# Patient Record
Sex: Female | Born: 1985 | Race: Black or African American | Hispanic: No | Marital: Single | Smoking: Never smoker
Health system: Southern US, Community
[De-identification: ages and names within clinical notes are randomized; demographics above are authoritative.]

## PROBLEM LIST (undated history)

## (undated) DIAGNOSIS — Z789 Other specified health status: Secondary | ICD-10-CM

## (undated) HISTORY — PX: NO PAST SURGERIES: SHX2092

---
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
## 2007-06-15 ENCOUNTER — Emergency Department (HOSPITAL_COMMUNITY): Admission: EM | Admit: 2007-06-15 | Discharge: 2007-06-15 | Payer: Self-pay | Admitting: Emergency Medicine

## 2007-09-02 ENCOUNTER — Inpatient Hospital Stay (HOSPITAL_COMMUNITY): Admission: AD | Admit: 2007-09-02 | Discharge: 2007-09-02 | Payer: Self-pay | Admitting: Obstetrics & Gynecology

## 2007-09-03 ENCOUNTER — Inpatient Hospital Stay (HOSPITAL_COMMUNITY): Admission: AD | Admit: 2007-09-03 | Discharge: 2007-09-07 | Payer: Self-pay | Admitting: Obstetrics

## 2007-09-08 ENCOUNTER — Inpatient Hospital Stay (HOSPITAL_COMMUNITY): Admission: AD | Admit: 2007-09-08 | Discharge: 2007-09-08 | Payer: Self-pay | Admitting: Obstetrics & Gynecology

## 2007-10-01 ENCOUNTER — Inpatient Hospital Stay (HOSPITAL_COMMUNITY): Admission: AD | Admit: 2007-10-01 | Discharge: 2007-10-01 | Payer: Self-pay | Admitting: Obstetrics

## 2007-10-07 ENCOUNTER — Ambulatory Visit (HOSPITAL_COMMUNITY): Admission: RE | Admit: 2007-10-07 | Discharge: 2007-10-07 | Payer: Self-pay | Admitting: Obstetrics & Gynecology

## 2007-10-11 ENCOUNTER — Inpatient Hospital Stay (HOSPITAL_COMMUNITY): Admission: RE | Admit: 2007-10-11 | Discharge: 2007-10-13 | Payer: Self-pay | Admitting: Obstetrics & Gynecology

## 2008-05-27 IMAGING — US US FETAL BPP W/O NONSTRESS
1 series · 14 of 28 positions shown · non-contrast
Comparison: none

OBSTETRICAL ULTRASOUND:

 This ultrasound exam was performed in the [HOSPITAL] Ultrasound Department.  The OB US report was generated in the AS system, and faxed to the ordering physician.  This report is also available in [REDACTED] PACS.

[Series 1: us fetal bpp w/o nonstress · non-contrast · 29 acquisitions, 14 frames shown]
[im 2/29]
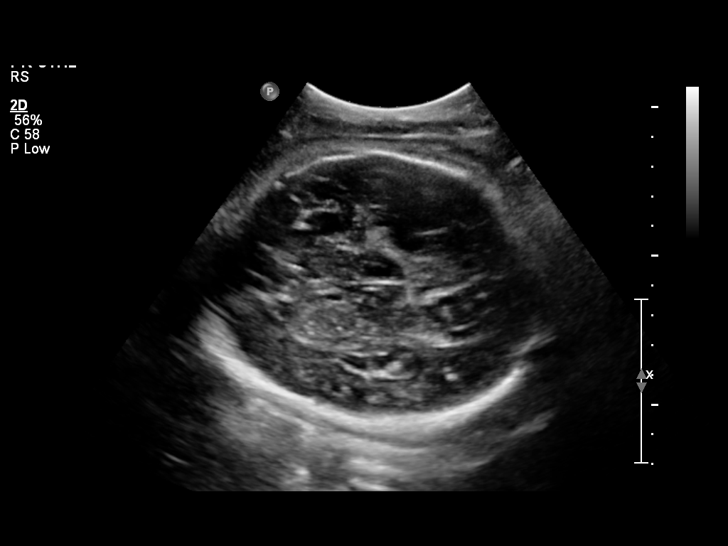
[im 4/29]
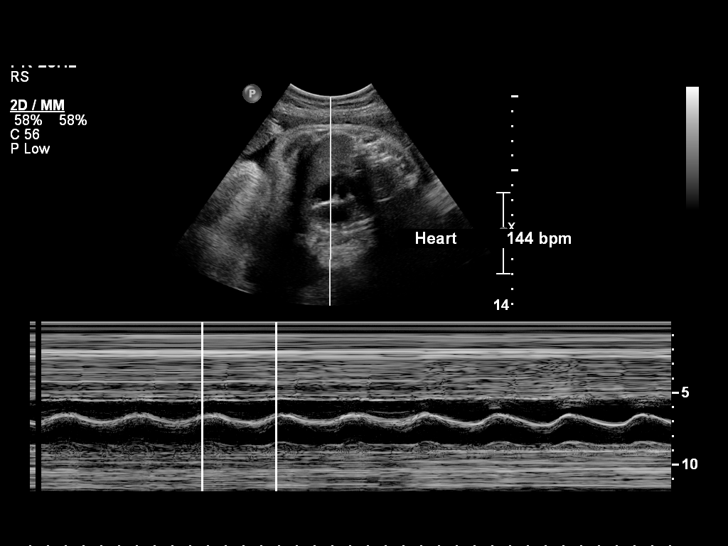
[im 6/29]
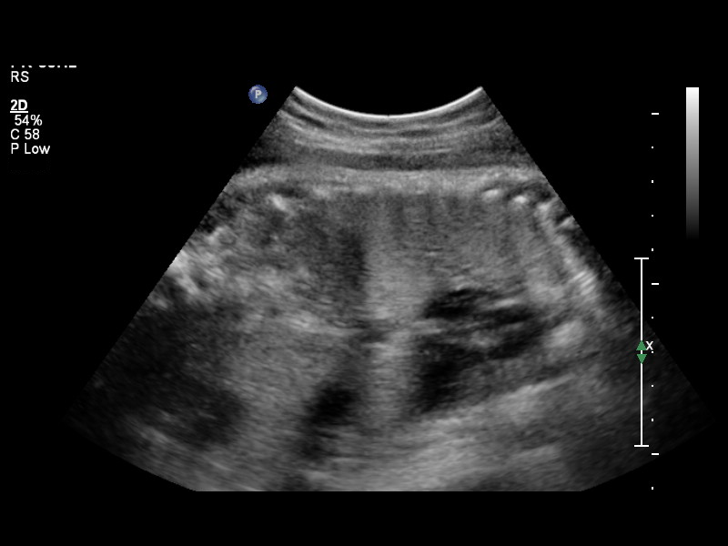
[im 8/29]
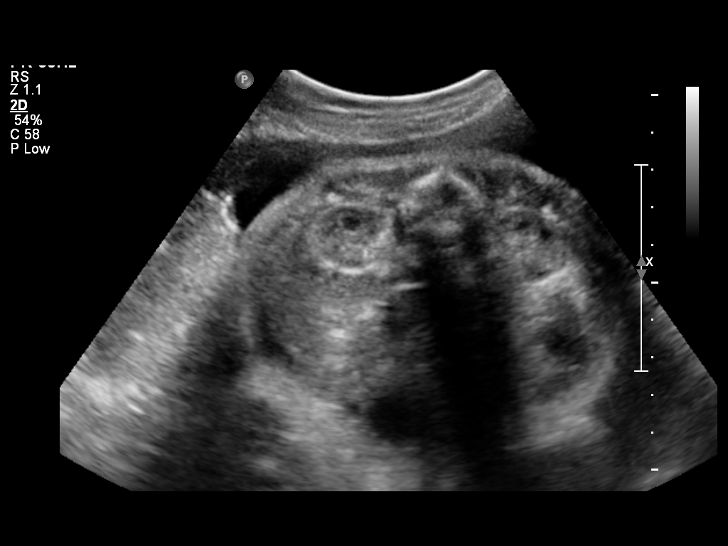
[im 10/29]
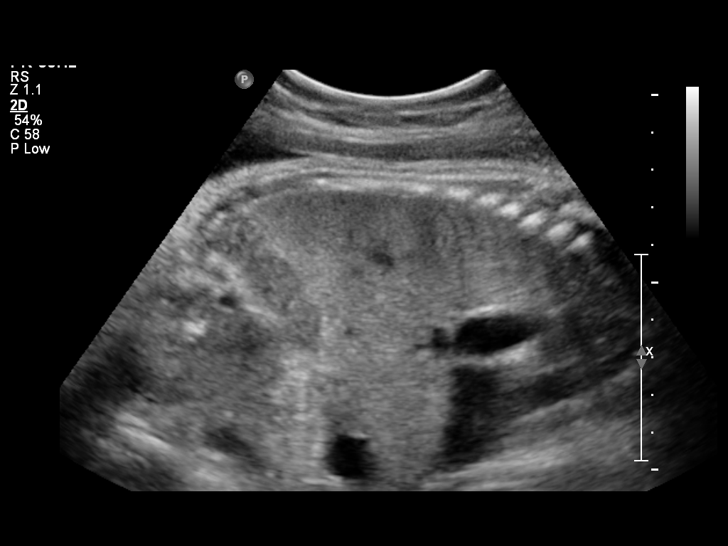
[im 12/29]
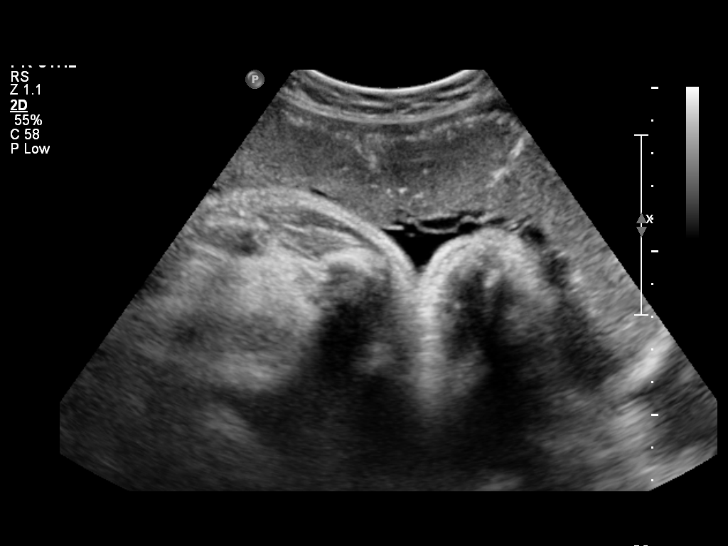
[im 14/29]
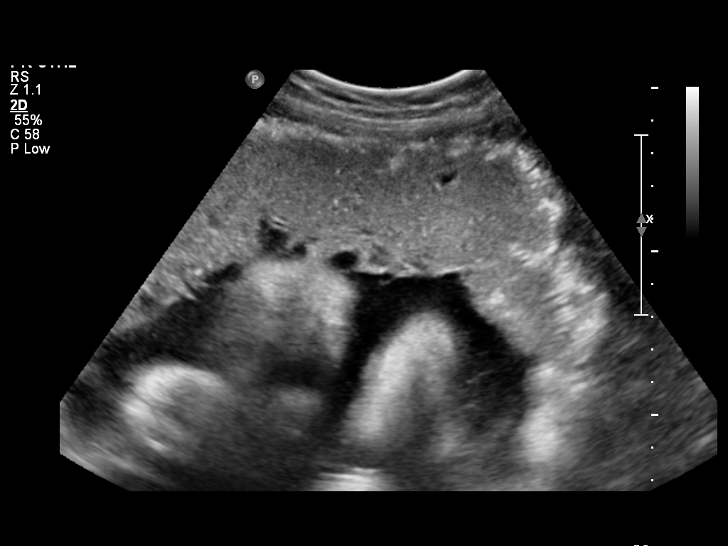
[im 16/29]
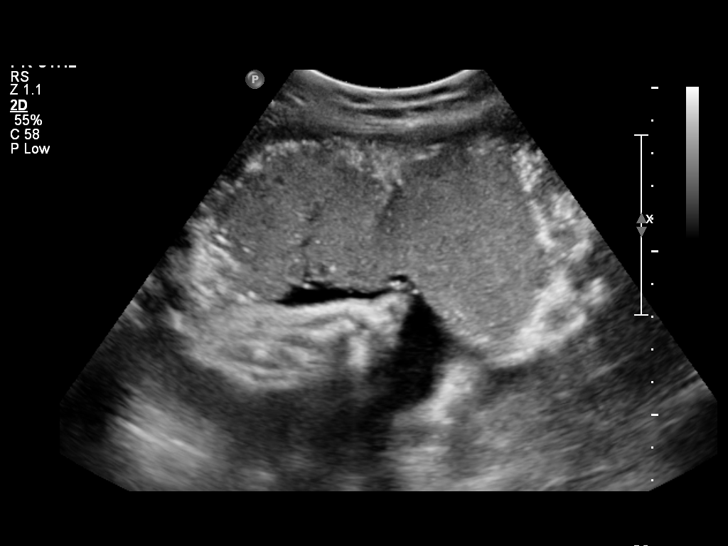
[im 18/29]
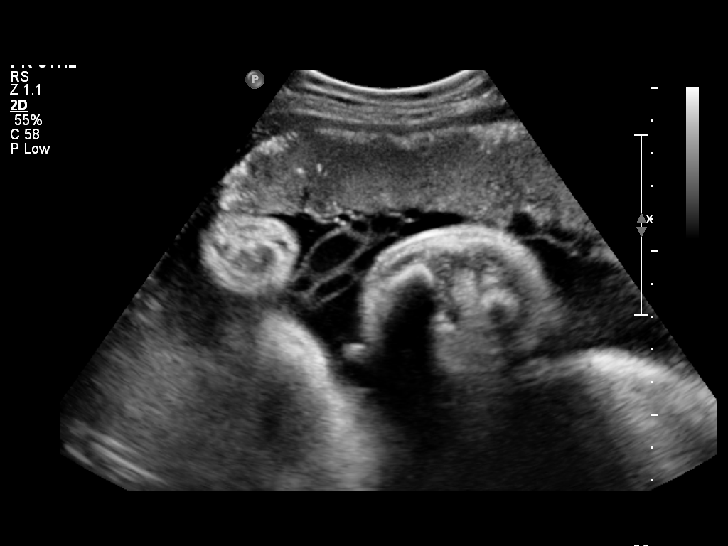
[im 20/29]
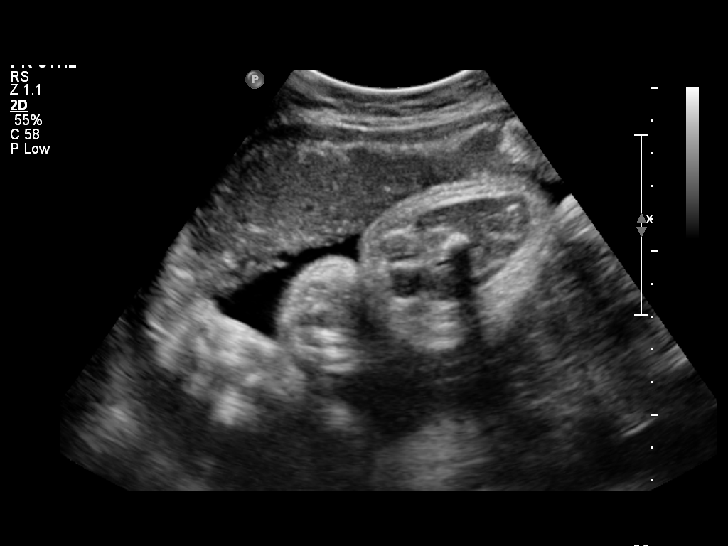
[im 22/29]
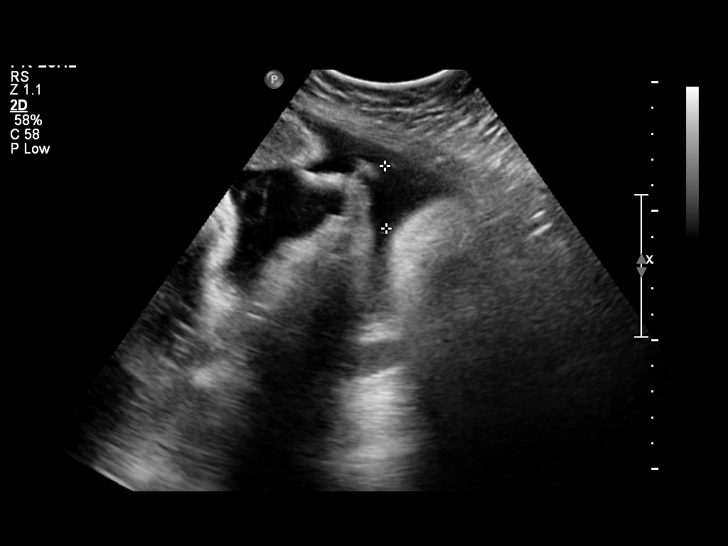
[im 24/29]
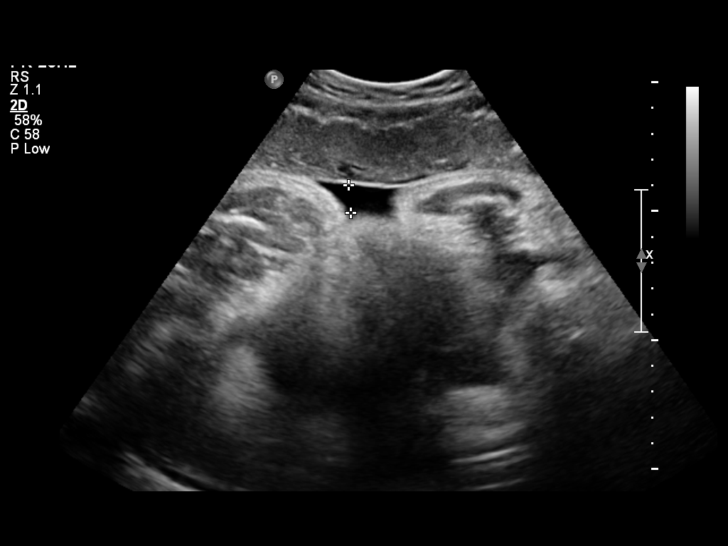
[im 26/29]
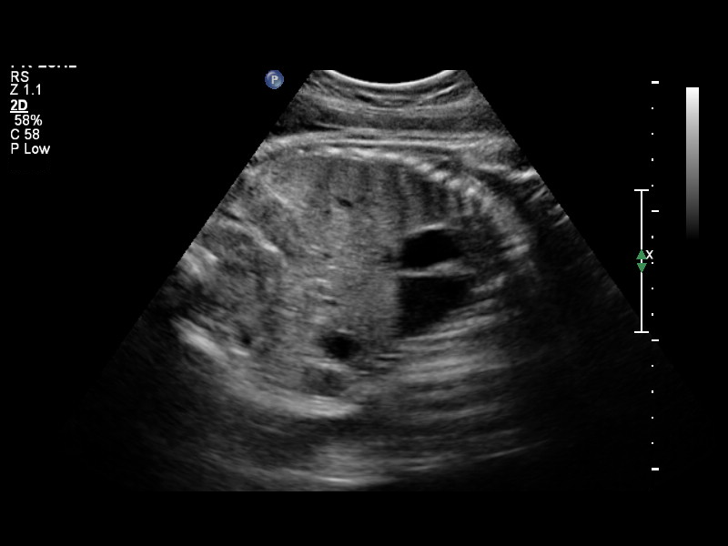
[im 29/29]
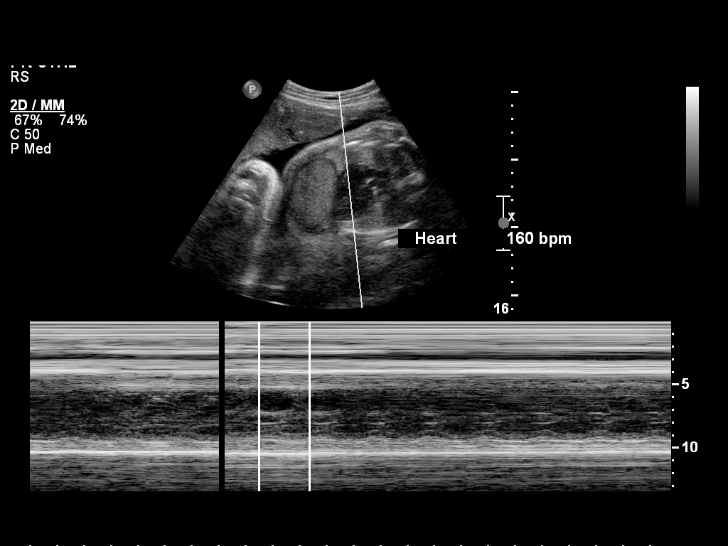

[14 of 28 positions shown; findings below may reference images not displayed]

IMPRESSION: See AS Obstetric US report.

## 2008-08-21 ENCOUNTER — Emergency Department (HOSPITAL_COMMUNITY): Admission: EM | Admit: 2008-08-21 | Discharge: 2008-08-21 | Payer: Self-pay | Admitting: Family Medicine

## 2008-08-22 ENCOUNTER — Emergency Department (HOSPITAL_COMMUNITY): Admission: EM | Admit: 2008-08-22 | Discharge: 2008-08-22 | Payer: Self-pay | Admitting: Emergency Medicine

## 2008-08-24 ENCOUNTER — Inpatient Hospital Stay (HOSPITAL_COMMUNITY): Admission: EM | Admit: 2008-08-24 | Discharge: 2008-08-26 | Payer: Self-pay | Admitting: Emergency Medicine

## 2009-02-21 ENCOUNTER — Emergency Department (HOSPITAL_COMMUNITY): Admission: EM | Admit: 2009-02-21 | Discharge: 2009-02-22 | Payer: Self-pay | Admitting: Emergency Medicine

## 2009-06-06 ENCOUNTER — Emergency Department (HOSPITAL_COMMUNITY): Admission: EM | Admit: 2009-06-06 | Discharge: 2009-06-06 | Payer: Self-pay | Admitting: Family Medicine

## 2009-10-24 ENCOUNTER — Encounter: Admission: RE | Admit: 2009-10-24 | Discharge: 2009-10-24 | Payer: Self-pay | Admitting: Internal Medicine

## 2010-01-29 ENCOUNTER — Emergency Department (HOSPITAL_COMMUNITY): Admission: EM | Admit: 2010-01-29 | Discharge: 2010-01-30 | Payer: Self-pay | Admitting: Emergency Medicine

## 2010-04-01 ENCOUNTER — Emergency Department (HOSPITAL_COMMUNITY): Admission: EM | Admit: 2010-04-01 | Discharge: 2010-04-01 | Payer: Self-pay | Admitting: Emergency Medicine

## 2010-04-11 ENCOUNTER — Inpatient Hospital Stay (HOSPITAL_COMMUNITY): Admission: AD | Admit: 2010-04-11 | Discharge: 2010-04-11 | Payer: Self-pay | Admitting: Obstetrics

## 2010-04-13 ENCOUNTER — Ambulatory Visit: Payer: Self-pay | Admitting: Advanced Practice Midwife

## 2010-04-13 ENCOUNTER — Inpatient Hospital Stay (HOSPITAL_COMMUNITY): Admission: AD | Admit: 2010-04-13 | Discharge: 2010-04-14 | Payer: Self-pay | Admitting: Obstetrics

## 2010-04-19 ENCOUNTER — Ambulatory Visit: Payer: Self-pay | Admitting: Nurse Practitioner

## 2010-04-19 ENCOUNTER — Inpatient Hospital Stay (HOSPITAL_COMMUNITY)
Admission: AD | Admit: 2010-04-19 | Discharge: 2010-04-20 | Payer: Self-pay | Source: Home / Self Care | Admitting: Obstetrics

## 2010-05-24 ENCOUNTER — Ambulatory Visit (HOSPITAL_COMMUNITY): Admission: RE | Admit: 2010-05-24 | Discharge: 2010-05-24 | Payer: Self-pay | Admitting: Obstetrics

## 2010-07-22 ENCOUNTER — Ambulatory Visit (HOSPITAL_COMMUNITY): Admission: RE | Admit: 2010-07-22 | Discharge: 2010-07-22 | Payer: Self-pay | Admitting: Obstetrics

## 2010-09-01 ENCOUNTER — Inpatient Hospital Stay (HOSPITAL_COMMUNITY)
Admission: AD | Admit: 2010-09-01 | Discharge: 2010-09-01 | Payer: Self-pay | Source: Home / Self Care | Attending: Obstetrics | Admitting: Obstetrics

## 2010-09-13 ENCOUNTER — Inpatient Hospital Stay (HOSPITAL_COMMUNITY)
Admission: AD | Admit: 2010-09-13 | Discharge: 2010-09-13 | Payer: Self-pay | Source: Home / Self Care | Attending: Obstetrics | Admitting: Obstetrics

## 2010-10-06 ENCOUNTER — Encounter: Payer: Self-pay | Admitting: Obstetrics

## 2010-10-06 ENCOUNTER — Encounter: Payer: Self-pay | Admitting: Obstetrics & Gynecology

## 2010-11-17 ENCOUNTER — Inpatient Hospital Stay (HOSPITAL_COMMUNITY)
Admission: AD | Admit: 2010-11-17 | Discharge: 2010-11-17 | Disposition: A | Discharge status: Home / Self Care | Payer: Medicaid Other | Source: Ambulatory Visit | Attending: Obstetrics | Admitting: Obstetrics

## 2010-11-17 DIAGNOSIS — O479 False labor, unspecified: Secondary | ICD-10-CM | POA: Insufficient documentation

## 2010-11-25 LAB — URINALYSIS, ROUTINE W REFLEX MICROSCOPIC
Bilirubin Urine: NEGATIVE
Bilirubin Urine: NEGATIVE
Hgb urine dipstick: NEGATIVE
Ketones, ur: NEGATIVE mg/dL
Nitrite: NEGATIVE
Protein, ur: NEGATIVE mg/dL
Specific Gravity, Urine: 1.02 (ref 1.005–1.030)
Urobilinogen, UA: 1 mg/dL (ref 0.0–1.0)
pH: 6.5 (ref 5.0–8.0)
pH: 7 (ref 5.0–8.0)

## 2010-11-25 LAB — WET PREP, GENITAL
Trich, Wet Prep: NONE SEEN
Yeast Wet Prep HPF POC: NONE SEEN

## 2010-11-25 LAB — FETAL FIBRONECTIN: Fetal Fibronectin: POSITIVE — AB

## 2010-11-25 LAB — URINE MICROSCOPIC-ADD ON

## 2010-11-28 ENCOUNTER — Inpatient Hospital Stay (HOSPITAL_COMMUNITY)
Admission: AD | Admit: 2010-11-28 | Discharge: 2010-12-01 | DRG: 775 | Disposition: A | Discharge status: Home / Self Care | Payer: 59 | Source: Ambulatory Visit | Attending: Obstetrics | Admitting: Obstetrics

## 2010-11-28 LAB — CBC
Platelets: 193 10*3/uL (ref 150–400)
RBC: 4.46 MIL/uL (ref 3.87–5.11)
WBC: 8.8 10*3/uL (ref 4.0–10.5)

## 2010-11-29 LAB — URINE MICROSCOPIC-ADD ON

## 2010-11-29 LAB — URINALYSIS, ROUTINE W REFLEX MICROSCOPIC
Nitrite: NEGATIVE
Specific Gravity, Urine: >1.03 — ABNORMAL HIGH (ref 1.005–1.030)
Urobilinogen, UA: 1 mg/dL (ref 0.0–1.0)

## 2010-11-29 LAB — RPR: RPR Ser Ql: NONREACTIVE

## 2010-11-30 LAB — CBC
MCH: 27.9 pg (ref 26.0–34.0)
MCH: 29.4 pg (ref 26.0–34.0)
MCHC: 32.2 g/dL (ref 30.0–36.0)
MCHC: 33.8 g/dL (ref 30.0–36.0)
Platelets: 172 10*3/uL (ref 150–400)
Platelets: 249 10*3/uL (ref 150–400)
RBC: 5.37 MIL/uL — ABNORMAL HIGH (ref 3.87–5.11)
RDW: 13.2 % (ref 11.5–15.5)

## 2010-11-30 LAB — ABO/RH: ABO/RH(D): O POS

## 2010-11-30 LAB — URINE MICROSCOPIC-ADD ON

## 2010-11-30 LAB — WET PREP, GENITAL
Trich, Wet Prep: NONE SEEN
Yeast Wet Prep HPF POC: NONE SEEN

## 2010-11-30 LAB — DIFFERENTIAL
Basophils Absolute: 0 10*3/uL (ref 0.0–0.1)
Eosinophils Absolute: 0.1 10*3/uL (ref 0.0–0.7)
Eosinophils Relative: 1 % (ref 0–5)
Monocytes Absolute: 0.3 10*3/uL (ref 0.1–1.0)
Neutrophils Relative %: 61 % (ref 43–77)

## 2010-11-30 LAB — URINE CULTURE: Colony Count: 35000

## 2010-11-30 LAB — URINALYSIS, ROUTINE W REFLEX MICROSCOPIC
Glucose, UA: NEGATIVE mg/dL
Glucose, UA: NEGATIVE mg/dL
Hgb urine dipstick: NEGATIVE
Ketones, ur: 40 mg/dL — AB
Ketones, ur: >80 mg/dL — AB
Leukocytes, UA: NEGATIVE
Nitrite: NEGATIVE
Protein, ur: 30 mg/dL — AB
Protein, ur: NEGATIVE mg/dL
Urobilinogen, UA: 2 mg/dL — ABNORMAL HIGH (ref 0.0–1.0)

## 2010-11-30 LAB — GC/CHLAMYDIA PROBE AMP, GENITAL: GC Probe Amp, Genital: NEGATIVE

## 2010-11-30 LAB — POCT PREGNANCY, URINE: Preg Test, Ur: POSITIVE

## 2010-12-02 LAB — STREP A DNA PROBE: Group A Strep Probe: NEGATIVE

## 2010-12-02 LAB — RAPID STREP SCREEN (MED CTR MEBANE ONLY): Streptococcus, Group A Screen (Direct): NEGATIVE

## 2010-12-20 LAB — URINE CULTURE

## 2010-12-20 LAB — POCT URINALYSIS DIP (DEVICE)
Bilirubin Urine: NEGATIVE
Glucose, UA: NEGATIVE mg/dL
Nitrite: NEGATIVE
Specific Gravity, Urine: 1.02 (ref 1.005–1.030)
Urobilinogen, UA: 0.2 mg/dL (ref 0.0–1.0)
pH: 7 (ref 5.0–8.0)

## 2011-01-28 NOTE — H&P (Signed)
Kayla Brandt, Kayla Brandt             ACCOUNT NO.:  [id number]   MEDICAL RECORD NO.:  [id number]          PATIENT TYPE:  INP   LOCATION:  1823                         FACILITY:  [facility]   PHYSICIAN:  Vania Rea, M.D. DATE OF BIRTH:  1986-05-29   DATE OF ADMISSION:  08/23/2008  DATE OF DISCHARGE:                              HISTORY & PHYSICAL   PRIMARY CARE PHYSICIAN:  Unassigned.   CHIEF COMPLAINT:  Painful sore throat, difficulty breathing.   HISTORY OF PRESENT ILLNESS:  This is a 26 year old African American lady  with no significant past medical history who presents with a history of  fever, sore throat for 3-4 days.  Patient visited an urgent care center  3 days ago.  Had negative rapid strep test and was sent home with  oxycodone and ibuprofen liquid.  The patient came to the emergency room  yesterday because of continued sore throat, fever, nausea, vomiting, and  feeling sick.  She again had a rapid strep test which was negative but  had an infectious mono screen which was positive.  She was given IV  fluids and discharged home to continue symptomatic treatment.  However,  the patient returns today complaining of progressive swelling in her  throat, pain, and difficulty breathing.  Patient also complains of  nausea and vomiting.  She denies any headache.  Her last menstrual  period was December 4th, and it was normal.   PAST MEDICAL HISTORY:  Other than above, none significant.   MEDICATIONS:  1. Ibuprofen suspension 300 mg every 4-6 hours p.r.n.  2. Oxycodone/acetaminophen liquid 7.5/500 every 4-6 hours p.r.n.   ALLERGIES:  No known drug allergies.   SOCIAL HISTORY:  She denies tobacco, alcohol, or illicit drug use.  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   FAMILY HISTORY:  Significant for diabetes and hypertension in her  grandmother, but no other sicknesses in her immediate family.  She lives  at home with her parents  and siblings.  None of them have any type of  upper respiratory illness.   REVIEW OF SYSTEMS:  Other than noted above, 10-point review of systems  is unremarkable.   PHYSICAL EXAMINATION:  A mildly obese young African American lady  sitting up in the stretcher, ill-looking, mouth-breathing.  VITALS:  Temperature 101.7, pulse 130, respirations 26, blood pressure  127/83.  She is saturating at 99% on 2 liters.  Pupils are equal and round.  Mucous membranes are dry.  She is  anicteric.  She is mouth-breathing.  Her mouth is dry.  The inside of  her mouth is discolored red by the medicine she has been taking.  Her  tonsils are enlarged and crowding her pharynx and touching in the  middle.  Both tonsils are covered by grayish-yellow exudate.  No  intraoral rash is appreciated.  No cervical lymphadenopathy or thyromegaly is appreciated.  CHEST:  Clear to auscultation bilaterally.  CARDIOVASCULAR:  She is tachycardic without murmur.  ABDOMEN:  Obese, soft, and nontender.  No masses are felt.  EXTREMITIES:  Without edema.  She has 3+ pulses bilaterally.  No calf  tenderness.  CENTRAL NERVOUS SYSTEM:  Cranial nerves II-XII are grossly intact.  She  has no focal neurologic deficit.   LABS:  White count 12.4, down from 14.9 on the 8th.  Her hemoglobin is  13.3, down from 15.3 on the 7th.  Her platelets are 213.  She has 53%  neutrophils.  Her absolute neutrophil count is 6.6, which is down from  10.5 on the 7th.  Her absolute monocyte count is also elevated at 1.6.  Absolute neutrophil count is normal at 4.3.  The smear describes  atypical lymphocytes suggesting a reactive infection.  Serum chemistry  shows a sodium of 131, down from 144 on the 7th.  Her potassium is 3.8.  Her chloride is 102.  Her CO2 is down to 17.  Glucose is 93.  BUN 7,  creatinine 0.67.  Her calcium is 8.1.  Strep throat done on the 8th was  negative.  Urinalysis today shows greater than 80 ketones, 100 protein,   negative nitrites and leukocyte esterase, over 100 protein, specific  gravity 1.027.   ASSESSMENT:  1. Acute infectious mononucleosis with respiratory compromise due to      crowding of the pharynx.  2. Hyponatremia due to dehydration.   PLAN:  We will admit this lady for anti-inflammatory therapy with NSAIDs  and steroids.  Will go ahead and give broad-spectrum antibiotic coverage  in case of secondary infection.  She does not have symptoms of an acute  viral illness such as H1N1 but will keep her in respiratory isolation.  Other plans as per orders.      Vania Rea, M.D.  Electronically Signed     LC/MEDQ  D:  08/24/2008  T:  08/24/2008  Job:  [id number]

## 2011-01-28 NOTE — Discharge Summary (Signed)
Kayla Brandt, Kayla Brandt             ACCOUNT NO.:  [id number]   MEDICAL RECORD NO.:  [id number]          PATIENT TYPE:  INP   LOCATION:  4712                         FACILITY:  [facility]   PHYSICIAN:  Eduard Clos, MDDATE OF BIRTH:  12-Sep-1986   DATE OF ADMISSION:  08/23/2008  DATE OF DISCHARGE:                               DISCHARGE SUMMARY   HOSPITAL COURSE:  A 25 year old female with no significant past medical  history presented to the ER with complaints of increasing painful sore  throat with difficulty breathing.  The patient was admitted because of  difficulty breathing and swallowing.  Monospot test was positive.  Rapid  strep test was negative.  The patient was admitted to medical floor, was  started on IV steroids, antibiotic fluids, gradually the patient's  symptoms improved, and she was started on diet.  At this time, her  symptoms have significantly improved and she is requesting discharge.   At this time, the patient is able to breath without any discomfort, able  to swallow, and has had dinner 100% and breakfast 100%.  We will  discharge the patient home with 3 more days of Zithromax, have reviewed  up to date wherein they have indicated steroids for mononucleosis with  presentation of difficulty breathing.  We will taper steroids for over 6  days.  The patient's lymph nodes were examined, and it was not showing  any significant  lymphadenopathy at this time, and there was no  splenomegaly appreciable.  Anyway, I have discussed with the patient,  and advised no contact sports for at least 1 month or until cleared by  PCP.   PROCEDURE:  August 21, 2008, x-ray of the neck and soft tissues shows  enlarged lymph nodes in [place] ring.   FINAL DIAGNOSES:  1. Acute pharyngitis probably from infectious mononucleosis.  2. Hyponatremia from dehydration, resolved.   DISCHARGE MEDICATIONS:  1. Zithromax 250 mg p.o. daily for 3 days.  2. Prednisone 20 mg p.o. daily for 2  days, followed by 10 mg p.o.      daily for 2 days, followed by 5 mg p.o. daily for 2 days, and stop.  3. Omeprazole 40 mg p.o. daily.  4. Motrin 800 mg p.o. q.8 h. p.r.n. pain.   The patient advised to follow up with the primary care physician within  the next 7 days.  I have provided Dr. Della Goo number.  The  patient advised to call and make an appointment, as she has no PCP.  No  contact sports for at least 1 month or until cleared by PCP, in any  event where the patient develops difficulty breathing or swallowing  again, the patient advised to go to the nearest ER as soon as possible.      Eduard Clos, MD  Electronically Signed    ANK/MEDQ  D:  08/26/2008  T:  08/26/2008  Job:  [id number]

## 2011-01-28 NOTE — Discharge Summary (Signed)
NAMEDONNEISHA, BEANE             ACCOUNT NO.:  [id number]   MEDICAL RECORD NO.:  [id number]          PATIENT TYPE:  INP   LOCATION:  9151                          FACILITY:  [facility]   PHYSICIAN:  Roseanna Rainbow, M.D.DATE OF BIRTH:  August 24, 1986   DATE OF ADMISSION:  09/03/2007  DATE OF DISCHARGE:  09/07/2007                               DISCHARGE SUMMARY   DISCHARGE DIAGNOSES:  1. Intrauterine pregnancy at 35 plus weeks.  2. Threatened preterm labor.   CHIEF COMPLAINT:  The patient is a 25 year old, para 2, with an  intrauterine pregnancy, who presents at 78 plus weeks complaining of  uterine contractions.   HISTORY OF PRESENT ILLNESS:  The patient had presented to the Maternity  Admissions Unit one day prior with a similar complaint.  She was given a  dose of subcutaneous Terbutaline, IV fluid, and was discharged.   PAST OBSTETRIC HISTORY:  1. A preterm delivery at 25 weeks.  2. A spontaneous vaginal delivery at 37 weeks.   PAST MEDICAL HISTORY:  She denies.   MEDICATIONS:  Prenatal vitamins.   ALLERGIES:  No known drug allergies.   SOCIAL HISTORY:  She is single, she denies any tobacco, ethanol, or drug  use.   PHYSICAL EXAMINATION:  VITAL SIGNS:  Stable, afebrile.  LUNGS:  Clear to auscultation bilaterally.  HEART:  Regular rate and rhythm.  ABDOMEN:  Gravid and nontender.  PELVIC EXAM:  The cervix is 1 to 2 cm dilated, 50% effaced, with the  vertex at minus 3 station.   ASSESSMENT:  1. Intrauterine pregnancy at 34 plus weeks.  2. Preterm labor.   PLAN:  Admission and magnesium sulfate tocolysis.   HOSPITAL COURSE:  The patient was admitted.  She was started on  magnesium sulfate for tocolysis.  Her contractions improved.  On  December 22nd, the magnesium sulfate was discontinued.  On December  23rd, she had a rare uterine contraction.  A sterile vaginal exam was  without change.  An ultrasound showed a normal amniotic fluid index, no  praevia, estimated  fetal weight at the 55th percentile.  She was  subsequently discharged to home.   CONDITION:  Stable.   DIET:  Regular.   ACTIVITY:  Modified bedrest.   DISPOSITION:  The patient had a followup appointment with Dr. Clearance Coots on  December 29th at 1 p.m.      Roseanna Rainbow, M.D.  Electronically Signed     LAJ/MEDQ  D:  10/01/2007  T:  10/01/2007  Job:  [id number]

## 2011-05-05 ENCOUNTER — Inpatient Hospital Stay (INDEPENDENT_AMBULATORY_CARE_PROVIDER_SITE_OTHER)
Admission: RE | Admit: 2011-05-05 | Discharge: 2011-05-05 | Disposition: A | Discharge status: Home / Self Care | Payer: 59 | Source: Ambulatory Visit | Attending: Emergency Medicine | Admitting: Emergency Medicine

## 2011-05-05 DIAGNOSIS — N390 Urinary tract infection, site not specified: Secondary | ICD-10-CM

## 2011-05-05 LAB — POCT URINALYSIS DIP (DEVICE)
Nitrite: POSITIVE — AB
Protein, ur: 100 mg/dL — AB
pH: 7 (ref 5.0–8.0)

## 2011-05-05 LAB — POCT PREGNANCY, URINE: Preg Test, Ur: NEGATIVE

## 2011-06-05 LAB — CBC
MCHC: 34.5
Platelets: 202
Platelets: 233
RBC: 3.73 — ABNORMAL LOW
RDW: 13.7
WBC: 13 — ABNORMAL HIGH

## 2011-06-05 LAB — RPR: RPR Ser Ql: NONREACTIVE

## 2011-06-05 LAB — CCBB MATERNAL DONOR DRAW

## 2011-06-20 LAB — BASIC METABOLIC PANEL
BUN: 7 mg/dL (ref 6–23)
CO2: 17 mEq/L — ABNORMAL LOW (ref 19–32)
Chloride: 102 mEq/L (ref 96–112)
Chloride: 114 mEq/L — ABNORMAL HIGH (ref 96–112)
Creatinine, Ser: 0.67 mg/dL (ref 0.4–1.2)
GFR calc Af Amer: >60 mL/min (ref >60)
GFR calc non Af Amer: >60 mL/min (ref >60)
Potassium: 3.8 mEq/L (ref 3.5–5.1)
Potassium: 4.5 mEq/L (ref 3.5–5.1)
Sodium: 140 mEq/L (ref 135–145)

## 2011-06-20 LAB — URINE CULTURE
Colony Count: 3000
Special Requests: NEGATIVE

## 2011-06-20 LAB — DIFFERENTIAL
Basophils Absolute: 0 10*3/uL (ref 0.0–0.1)
Basophils Absolute: 0.1 10*3/uL (ref 0.0–0.1)
Basophils Relative: 0 % (ref 0–1)
Basophils Relative: 0 % (ref 0–1)
Eosinophils Absolute: 0 10*3/uL (ref 0.0–0.7)
Eosinophils Absolute: 0.1 10*3/uL (ref 0.0–0.7)
Eosinophils Relative: 0 % (ref 0–5)
Eosinophils Relative: 0 % (ref 0–5)
Lymphocytes Relative: 81 % — ABNORMAL HIGH (ref 12–46)
Lymphs Abs: 10.5 10*3/uL — ABNORMAL HIGH (ref 0.7–4.0)
Lymphs Abs: 6.6 10*3/uL — ABNORMAL HIGH (ref 0.7–4.0)
Lymphs Abs: 9.8 10*3/uL — ABNORMAL HIGH (ref 0.7–4.0)
Monocytes Absolute: 0.5 10*3/uL (ref 0.1–1.0)
Monocytes Absolute: 1.3 10*3/uL — ABNORMAL HIGH (ref 0.1–1.0)
Monocytes Absolute: 1.6 10*3/uL — ABNORMAL HIGH (ref 0.1–1.0)
Monocytes Relative: 4 % (ref 3–12)
Monocytes Relative: 9 % (ref 3–12)
Neutro Abs: 1.9 10*3/uL (ref 1.7–7.7)
Neutrophils Relative %: 15 % — ABNORMAL LOW (ref 43–77)
Neutrophils Relative %: 34 % — ABNORMAL LOW (ref 43–77)
Promyelocytes Absolute: 0 %

## 2011-06-20 LAB — URINALYSIS, ROUTINE W REFLEX MICROSCOPIC
Bilirubin Urine: NEGATIVE
Bilirubin Urine: NEGATIVE
Glucose, UA: NEGATIVE mg/dL
Hgb urine dipstick: NEGATIVE
Ketones, ur: >80 mg/dL — AB
Ketones, ur: >80 mg/dL — AB
Ketones, ur: 15 — AB
Ketones, ur: NEGATIVE
Leukocytes, UA: NEGATIVE
Nitrite: NEGATIVE
Protein, ur: 100 mg/dL — AB
Protein, ur: 30 mg/dL — AB
Protein, ur: NEGATIVE
Specific Gravity, Urine: 1.015
Urobilinogen, UA: 1 mg/dL (ref 0.0–1.0)
Urobilinogen, UA: 0.2
pH: 6 (ref 5.0–8.0)
pH: 6

## 2011-06-20 LAB — RAPID STREP SCREEN (MED CTR MEBANE ONLY): Streptococcus, Group A Screen (Direct): NEGATIVE

## 2011-06-20 LAB — CBC
HCT: 36.8 % (ref 36.0–46.0)
HCT: 39.2 % (ref 36.0–46.0)
HCT: 42.2 % (ref 36.0–46.0)
Hemoglobin: 12.5 g/dL (ref 12.0–15.0)
Hemoglobin: 14.7 g/dL (ref 12.0–15.0)
MCHC: 32.8 g/dL (ref 30.0–36.0)
MCHC: 33.9 g/dL (ref 30.0–36.0)
MCHC: 34.1 g/dL (ref 30.0–36.0)
MCV: 85.2 fL (ref 78.0–100.0)
MCV: 86.9 fL (ref 78.0–100.0)
Platelets: 212 10*3/uL (ref 150–400)
Platelets: 213 10*3/uL (ref 150–400)
Platelets: 235 10*3/uL (ref 150–400)
RBC: 4.24 MIL/uL (ref 3.87–5.11)
RBC: 4.6 MIL/uL (ref 3.87–5.11)
RBC: 5.11 MIL/uL (ref 3.87–5.11)
RDW: 12.3 % (ref 11.5–15.5)
WBC: 12.6 10*3/uL — ABNORMAL HIGH (ref 4.0–10.5)
WBC: 12.9 10*3/uL — ABNORMAL HIGH (ref 4.0–10.5)

## 2011-06-20 LAB — HEPATIC FUNCTION PANEL
ALT: 75 U/L — ABNORMAL HIGH (ref 0–35)
AST: 40 U/L — ABNORMAL HIGH (ref 0–37)
Alkaline Phosphatase: 70 U/L (ref 39–117)
Bilirubin, Direct: 0.1 mg/dL (ref 0.0–0.3)
Indirect Bilirubin: 0.3 mg/dL (ref 0.3–0.9)

## 2011-06-20 LAB — POCT RAPID STREP A: Streptococcus, Group A Screen (Direct): NEGATIVE

## 2011-06-20 LAB — URINE MICROSCOPIC-ADD ON

## 2011-06-20 LAB — POCT I-STAT, CHEM 8
BUN: 6 mg/dL (ref 6–23)
Creatinine, Ser: 1 mg/dL (ref 0.4–1.2)
Glucose, Bld: 86 mg/dL (ref 70–99)
Potassium: 3.8 mEq/L (ref 3.5–5.1)
Sodium: 144 mEq/L (ref 135–145)
TCO2: 26 mmol/L (ref 0–100)

## 2011-06-20 LAB — STREP A DNA PROBE

## 2012-07-12 ENCOUNTER — Emergency Department (HOSPITAL_COMMUNITY)
Admission: EM | Admit: 2012-07-12 | Discharge: 2012-07-12 | Disposition: A | Discharge status: Home / Self Care | Payer: 59 | Source: Home / Self Care | Attending: Family Medicine | Admitting: Family Medicine

## 2012-07-12 ENCOUNTER — Encounter (HOSPITAL_COMMUNITY): Payer: Self-pay | Admitting: Emergency Medicine

## 2012-07-12 DIAGNOSIS — N390 Urinary tract infection, site not specified: Secondary | ICD-10-CM

## 2012-07-12 LAB — POCT URINALYSIS DIP (DEVICE)
Glucose, UA: NEGATIVE mg/dL
Protein, ur: >=300 mg/dL — AB
Urobilinogen, UA: 1 mg/dL (ref 0.0–1.0)

## 2012-07-12 LAB — POCT PREGNANCY, URINE: Preg Test, Ur: NEGATIVE

## 2012-07-12 MED ORDER — CEPHALEXIN 500 MG PO CAPS: 500.0000 mg | ORAL_CAPSULE | Freq: Four times a day (QID) | ORAL | Status: DC

## 2012-07-12 NOTE — ED Provider Notes (Signed)
History     CSN: [id number]  Arrival date & time 07/12/12  0865   First MD Initiated Contact with Patient 07/12/12 1955      Chief Complaint  Patient presents with  . Urinary Tract Infection    (Consider location/radiation/quality/duration/timing/severity/associated sxs/prior treatment) Patient is a 26 y.o. female presenting with urinary tract infection. The history is provided by the patient.  Urinary Tract Infection This is a new problem. The current episode started more than 2 days ago. The problem has been gradually worsening. Pertinent negatives include no abdominal pain. Associated symptoms comments: No past h/o uti , nl menses ,no vag d/c.Marland Kitchen    History reviewed. No pertinent past medical history.  No past surgical history on file.  No family history on file.  History  Substance Use Topics  . Smoking status: Not on file  . Smokeless tobacco: Not on file  . Alcohol Use: Not on file    OB History    Grav Para Term Preterm Abortions TAB SAB Ect Mult Living                  Review of Systems  Constitutional: Negative.   Gastrointestinal: Negative.  Negative for abdominal pain.  Genitourinary: Positive for dysuria, urgency, frequency and hematuria.    Allergies  Review of patient's allergies indicates no known allergies.  Home Medications   Current Outpatient Rx  Name Route Sig Dispense Refill  . CEPHALEXIN 500 MG PO CAPS Oral Take 1 capsule (500 mg total) by mouth 4 (four) times daily. Take all of medicine and drink lots of fluids 20 capsule 0    BP 132/79  Pulse 108  Temp 98.4 F (36.9 C) (Oral)  Resp 16  SpO2 94%  Physical Exam  Nursing note and vitals reviewed. Constitutional: She is oriented to person, place, and time. She appears well-developed and well-nourished.  Abdominal: Soft. Bowel sounds are normal. She exhibits no distension and no mass. There is no tenderness. There is no rebound and no guarding.  Neurological: She is alert and  oriented to person, place, and time.  Skin: Skin is warm and dry.    ED Course  Procedures (including critical care time)  Labs Reviewed  POCT URINALYSIS DIP (DEVICE) - Abnormal; Notable for the following:    Ketones, ur TRACE (*)     Hgb urine dipstick LARGE (*)     Protein, ur >=300 (*)     Nitrite POSITIVE (*)     Leukocytes, UA MODERATE (*)  Biochemical Testing Only. Please order routine urinalysis from main lab if confirmatory testing is needed.   All other components within normal limits  POCT PREGNANCY, URINE   No results found.   1. UTI (lower urinary tract infection)       MDM  U/a pos.        Linna Hoff, MD 07/12/12 2020

## 2012-07-12 NOTE — ED Notes (Signed)
Reports sharp abdominal pain with blood in urine since 10/24. Frequently urinating, burning but denies odor.

## 2013-12-23 ENCOUNTER — Emergency Department (HOSPITAL_COMMUNITY)
Admission: EM | Admit: 2013-12-23 | Discharge: 2013-12-23 | Discharge status: Against Medical Advice | Payer: 59 | Attending: Emergency Medicine | Admitting: Emergency Medicine

## 2013-12-23 ENCOUNTER — Encounter (HOSPITAL_COMMUNITY): Payer: Self-pay | Admitting: Emergency Medicine

## 2013-12-23 DIAGNOSIS — R252 Cramp and spasm: Secondary | ICD-10-CM | POA: Insufficient documentation

## 2013-12-23 LAB — URINALYSIS, ROUTINE W REFLEX MICROSCOPIC
GLUCOSE, UA: NEGATIVE mg/dL
Ketones, ur: >80 mg/dL — AB
Nitrite: NEGATIVE
PH: 5.5 (ref 5.0–8.0)
Protein, ur: NEGATIVE mg/dL
SPECIFIC GRAVITY, URINE: 1.034 — AB (ref 1.005–1.030)
Urobilinogen, UA: 1 mg/dL (ref 0.0–1.0)

## 2013-12-23 LAB — PREGNANCY, URINE: PREG TEST UR: NEGATIVE

## 2013-12-23 LAB — URINE MICROSCOPIC-ADD ON

## 2013-12-23 NOTE — ED Notes (Signed)
The pt reported that her rt hand was cramped up and she could not use it.  However she used it to slide her hair backward abd a few minutes later her hand appeared noraml

## 2013-12-23 NOTE — ED Notes (Signed)
The pt is c/o cramps in both her arms hands and lower exteremities.  She also saw a large clot when she uirinated  Earlier today.  llmp feb

## 2013-12-23 NOTE — ED Notes (Signed)
Pt states she does not want to stay to be seen. States she wants to go home and go to bed.  Encouraged pt to stay and explained triage.  Family member states pt is leaving.

## 2013-12-24 ENCOUNTER — Emergency Department (HOSPITAL_COMMUNITY)
Admission: EM | Admit: 2013-12-24 | Discharge: 2013-12-24 | Disposition: A | Discharge status: Home / Self Care | Payer: 59 | Attending: Emergency Medicine | Admitting: Emergency Medicine

## 2013-12-24 ENCOUNTER — Encounter (HOSPITAL_COMMUNITY): Payer: Self-pay | Admitting: Emergency Medicine

## 2013-12-24 DIAGNOSIS — F411 Generalized anxiety disorder: Secondary | ICD-10-CM | POA: Insufficient documentation

## 2013-12-24 DIAGNOSIS — E876 Hypokalemia: Secondary | ICD-10-CM | POA: Insufficient documentation

## 2013-12-24 DIAGNOSIS — M79609 Pain in unspecified limb: Secondary | ICD-10-CM | POA: Insufficient documentation

## 2013-12-24 LAB — BASIC METABOLIC PANEL
BUN: 13 mg/dL (ref 6–23)
CO2: 22 mEq/L (ref 19–32)
Calcium: 9.2 mg/dL (ref 8.4–10.5)
Chloride: 99 mEq/L (ref 96–112)
Creatinine, Ser: 0.6 mg/dL (ref 0.50–1.10)
GLUCOSE: 92 mg/dL (ref 70–99)
Potassium: 2.7 mEq/L — CL (ref 3.7–5.3)
SODIUM: 138 meq/L (ref 137–147)

## 2013-12-24 LAB — CBC WITH DIFFERENTIAL/PLATELET
BASOS PCT: 0 % (ref 0–1)
Basophils Absolute: 0 10*3/uL (ref 0.0–0.1)
EOS ABS: 0 10*3/uL (ref 0.0–0.7)
Eosinophils Relative: 1 % (ref 0–5)
HCT: 43 % (ref 36.0–46.0)
HEMOGLOBIN: 14.9 g/dL (ref 12.0–15.0)
LYMPHS ABS: 0.9 10*3/uL (ref 0.7–4.0)
Lymphocytes Relative: 28 % (ref 12–46)
MCH: 30.1 pg (ref 26.0–34.0)
MCHC: 34.7 g/dL (ref 30.0–36.0)
MCV: 86.9 fL (ref 78.0–100.0)
MONOS PCT: 9 % (ref 3–12)
Monocytes Absolute: 0.3 10*3/uL (ref 0.1–1.0)
NEUTROS PCT: 61 % (ref 43–77)
Neutro Abs: 2 10*3/uL (ref 1.7–7.7)
Platelets: 236 10*3/uL (ref 150–400)
RBC: 4.95 MIL/uL (ref 3.87–5.11)
RDW: 12.5 % (ref 11.5–15.5)
WBC: 3.2 10*3/uL — ABNORMAL LOW (ref 4.0–10.5)

## 2013-12-24 LAB — HCG, SERUM, QUALITATIVE: Preg, Serum: NEGATIVE

## 2013-12-24 MED ORDER — POTASSIUM CHLORIDE CRYS ER 20 MEQ PO TBCR
40.0000 meq | EXTENDED_RELEASE_TABLET | Freq: Once | ORAL | Status: AC
  Administered 2013-12-24: 40 meq via ORAL
  Filled 2013-12-24: qty 2

## 2013-12-24 MED ORDER — POTASSIUM CHLORIDE ER 10 MEQ PO TBCR: 10.0000 meq | EXTENDED_RELEASE_TABLET | Freq: Two times a day (BID) | ORAL | Status: DC

## 2013-12-24 MED ORDER — SODIUM CHLORIDE 0.9 % IV SOLN
1000.0000 mL | INTRAVENOUS | Status: DC
  Administered 2013-12-24: 1000 mL via INTRAVENOUS

## 2013-12-24 MED ORDER — POTASSIUM CHLORIDE 10 MEQ/100ML IV SOLN
10.0000 meq | INTRAVENOUS | Status: AC
  Administered 2013-12-24 (×2): 10 meq via INTRAVENOUS
  Filled 2013-12-24 (×2): qty 100

## 2013-12-24 MED ORDER — SODIUM CHLORIDE 0.9 % IV SOLN
1000.0000 mL | Freq: Once | INTRAVENOUS | Status: AC
  Administered 2013-12-24: 1000 mL via INTRAVENOUS

## 2013-12-24 MED ORDER — ACETAMINOPHEN 325 MG PO TABS
650.0000 mg | ORAL_TABLET | Freq: Once | ORAL | Status: AC
  Administered 2013-12-24: 650 mg via ORAL
  Filled 2013-12-24: qty 2

## 2013-12-24 NOTE — ED Notes (Signed)
Pt BIB EMS. Pt c/o cramping in feet and hands, headache. Symptoms since yesterday. Pt also c/o decreased appetite. Hasn't eaten in 3 days per EMS. Pt denies N/V/D. Pt ambulatory to triage with steady independent gait.

## 2013-12-24 NOTE — Discharge Instructions (Signed)
Hypokalemia Hypokalemia means that the amount of potassium in the blood is lower than normal.Potassium is a chemical, called an electrolyte, that helps regulate the amount of fluid in the body. It also stimulates muscle contraction and helps nerves function properly.Most of the body's potassium is inside of cells, and only a very small amount is in the blood. Because the amount in the blood is so small, minor changes can be life-threatening. CAUSES  Antibiotics.  Diarrhea or vomiting.  Using laxatives too much, which can cause diarrhea.  Chronic kidney disease.  Water pills (diuretics).  Eating disorders (bulimia).  Low magnesium level.  Sweating a lot. SIGNS AND SYMPTOMS  Weakness.  Constipation.  Fatigue.  Muscle cramps.  Mental confusion.  Skipped heartbeats or irregular heartbeat (palpitations).  Tingling or numbness. DIAGNOSIS  Your health care provider can diagnose hypokalemia with blood tests. In addition to checking your potassium level, your health care provider may also check other lab tests. TREATMENT Hypokalemia can be treated with potassium supplements taken by mouth or adjustments in your current medicines. If your potassium level is very low, you may need to get potassium through a vein (IV) and be monitored in the hospital. A diet high in potassium is also helpful. Foods high in potassium are:  Nuts, such as peanuts and pistachios.  Seeds, such as sunflower seeds and pumpkin seeds.  Peas, lentils, and lima beans.  Whole grain and bran cereals and breads.  Fresh fruit and vegetables, such as apricots, avocado, bananas, cantaloupe, kiwi, oranges, tomatoes, asparagus, and potatoes.  Orange and tomato juices.  Red meats.  Fruit yogurt. HOME CARE INSTRUCTIONS  Take all medicines as prescribed by your health care provider.  Maintain a healthy diet by including nutritious food, such as fruits, vegetables, nuts, whole grains, and lean meats.  If  you are taking a laxative, be sure to follow the directions on the label. SEEK MEDICAL CARE IF:  Your weakness gets worse.  You feel your heart pounding or racing.  You are vomiting or having diarrhea.  You are diabetic and having trouble keeping your blood glucose in the normal range. SEEK IMMEDIATE MEDICAL CARE IF:  You have chest pain, shortness of breath, or dizziness.  You are vomiting or having diarrhea for more than 2 days.  You faint. MAKE SURE YOU:   Understand these instructions.  Will watch your condition.  Will get help right away if you are not doing well or get worse. Document Released: 09/01/2005 Document Revised: 06/22/2013 Document Reviewed: 03/04/2013 [employer] Patient Information 2014 [employer].  

## 2013-12-24 NOTE — ED Notes (Signed)
Pt reports she began yesterday having prickling bilateral foot pain located on the bottom of feet. She also  Reports having vaginal "spotting" with a clot. Vaginal bleeding has since subsided.  Last menstrual cycle being 11/10/13

## 2013-12-24 NOTE — ED Notes (Signed)
Bed: WA16 Expected date:  Expected time:  Means of arrival:  Comments: 

## 2013-12-24 NOTE — ED Notes (Signed)
Pt. Is for discharge,  Pt. Is finishing 2nd bag of KCL IV.

## 2013-12-24 NOTE — ED Provider Notes (Signed)
CSN: [id number]     Arrival date & time 12/24/13  1554 History   First MD Initiated Contact with Patient 12/24/13 1615     Chief Complaint  Patient presents with  . Cramping in extremities   . Dehydration     HPI Pt started having cramping in her hands yesterday suddenly. Her hands cramped up and contracted..  Patient states she was not having any other difficulty initially. She was not feeling anxious or short of breath. No vomiting or diarrhea.  It just started all of a sudden.  She has had some irregular periods. She does have a mioderate headache.  NO neck pain.  No trouble with vision, balance or coordination.  She has not any trouble with fevers or chills. No polyuria or polydipsia. No prior history of similar symptoms.  Patient has not had much of an appetite recently she denies any abdominal pain.  History reviewed. No pertinent past medical history. History reviewed. No pertinent past surgical history. No family history on file. History  Substance Use Topics  . Smoking status: Never Smoker   . Smokeless tobacco: Not on file  . Alcohol Use: Yes   OB History   Grav Para Term Preterm Abortions TAB SAB Ect Mult Living                 Review of Systems  All other systems reviewed and are negative.     Allergies  Review of patient's allergies indicates no known allergies.  Home Medications  No current outpatient prescriptions on file. BP 101/67  Pulse 100  Temp(Src) 98.2 F (36.8 C) (Oral)  Resp 16  SpO2 94%  LMP 10/25/2013 Physical Exam  Nursing note and vitals reviewed. Constitutional: She appears well-developed and well-nourished. No distress.  HENT:  Head: Normocephalic and atraumatic.  Right Ear: External ear normal.  Left Ear: External ear normal.  Eyes: Conjunctivae are normal. Right eye exhibits no discharge. Left eye exhibits no discharge. No scleral icterus.  Neck: Neck supple. No tracheal deviation present.  Cardiovascular: Normal rate, regular  rhythm and intact distal pulses.   Pulmonary/Chest: Effort normal and breath sounds normal. No stridor. No respiratory distress. She has no wheezes. She has no rales.  Abdominal: Soft. Bowel sounds are normal. She exhibits no distension. There is no tenderness. There is no rebound and no guarding.  Musculoskeletal: She exhibits no edema and no tenderness.  Neurological: She is alert. She has normal strength. No cranial nerve deficit (no facial droop, extraocular movements intact, no slurred speech) or sensory deficit. She exhibits normal muscle tone. She displays no seizure activity. Coordination normal.  Skin: Skin is warm and dry. No rash noted.  Psychiatric: Her mood appears anxious. She is not slowed and not withdrawn.    ED Course  Procedures (including critical care time) Labs Review Labs Reviewed  CBC WITH DIFFERENTIAL - Abnormal; Notable for the following:    WBC 3.2 (*)    All other components within normal limits  BASIC METABOLIC PANEL - Abnormal; Notable for the following:    Potassium 2.7 (*)    All other components within normal limits  HCG, SERUM, QUALITATIVE  NA AND K (SODIUM & POTASSIUM), RAND UR    Medications  0.9 %  sodium chloride infusion (0 mLs Intravenous Stopped 12/24/13 1737)    Followed by  0.9 %  sodium chloride infusion (1,000 mLs Intravenous New Bag/Given 12/24/13 1738)  potassium chloride 10 mEq in 100 mL IVPB (10 mEq Intravenous New Bag/Given  12/24/13 1958)  acetaminophen (TYLENOL) tablet 650 mg (650 mg Oral Given 12/24/13 1647)  potassium chloride SA (K-DUR,KLOR-CON) CR tablet 40 mEq (40 mEq Oral Given 12/24/13 1846)    MDM   Final diagnoses:  Hypokalemia    Patient's hypokalemia is likely cause of her muscle cramps. She has not had any trouble with vomiting or diarrhea. She denies any medications or supplements. Not certain as to the etiology of her hypokalemia. She will need further evaluation. Urine electrolytes have been ordered. I recommended  followup with primary care Dr. Patient understands the importance of this    Celene KrasJon R Audre Cenci, MD 12/24/13 2002

## 2013-12-24 NOTE — ED Notes (Signed)
MD Knapp at bedside 

## 2014-07-19 ENCOUNTER — Encounter (HOSPITAL_COMMUNITY): Payer: Self-pay | Admitting: *Deleted

## 2014-07-19 ENCOUNTER — Emergency Department (HOSPITAL_COMMUNITY)
Admission: EM | Admit: 2014-07-19 | Discharge: 2014-07-19 | Discharge status: Against Medical Advice | Payer: 59 | Attending: Emergency Medicine | Admitting: Emergency Medicine

## 2014-07-19 DIAGNOSIS — R110 Nausea: Secondary | ICD-10-CM | POA: Insufficient documentation

## 2014-07-19 LAB — URINALYSIS, ROUTINE W REFLEX MICROSCOPIC
Bilirubin Urine: NEGATIVE
Glucose, UA: NEGATIVE mg/dL
Hgb urine dipstick: NEGATIVE
Ketones, ur: NEGATIVE mg/dL
Nitrite: NEGATIVE
PROTEIN: NEGATIVE mg/dL
SPECIFIC GRAVITY, URINE: 1.026 (ref 1.005–1.030)
Urobilinogen, UA: 1 mg/dL (ref 0.0–1.0)
pH: 8 (ref 5.0–8.0)

## 2014-07-19 LAB — URINE MICROSCOPIC-ADD ON

## 2014-07-19 LAB — POC URINE PREG, ED: PREG TEST UR: NEGATIVE

## 2014-07-19 NOTE — ED Notes (Signed)
Pt reports having n/v every morning x the past month and reports irregular periods. Pt has not taken a preg test at home but insists she is not pregnant. No acute distress noted at triage.

## 2015-11-18 ENCOUNTER — Emergency Department (HOSPITAL_COMMUNITY)
Admission: EM | Admit: 2015-11-18 | Discharge: 2015-11-18 | Disposition: A | Discharge status: Home / Self Care | Payer: Self-pay | Attending: Emergency Medicine | Admitting: Emergency Medicine

## 2015-11-18 ENCOUNTER — Encounter (HOSPITAL_COMMUNITY): Payer: Self-pay | Admitting: *Deleted

## 2015-11-18 ENCOUNTER — Emergency Department (HOSPITAL_COMMUNITY): Payer: Self-pay

## 2015-11-18 DIAGNOSIS — J111 Influenza due to unidentified influenza virus with other respiratory manifestations: Secondary | ICD-10-CM | POA: Insufficient documentation

## 2015-11-18 DIAGNOSIS — Z79899 Other long term (current) drug therapy: Secondary | ICD-10-CM | POA: Insufficient documentation

## 2015-11-18 DIAGNOSIS — R69 Illness, unspecified: Secondary | ICD-10-CM

## 2015-11-18 MED ORDER — IBUPROFEN 200 MG PO TABS
600.0000 mg | ORAL_TABLET | Freq: Once | ORAL | Status: AC
  Administered 2015-11-18: 600 mg via ORAL
  Filled 2015-11-18: qty 3

## 2015-11-18 MED ORDER — BENZONATATE 100 MG PO CAPS: 100.0000 mg | ORAL_CAPSULE | Freq: Three times a day (TID) | ORAL | Status: DC

## 2015-11-18 MED ORDER — SODIUM CHLORIDE 0.9 % IV BOLUS (SEPSIS)
1000.0000 mL | Freq: Once | INTRAVENOUS | Status: AC
  Administered 2015-11-18: 1000 mL via INTRAVENOUS

## 2015-11-18 NOTE — ED Provider Notes (Signed)
CSN: [id number]     Arrival date & time 11/18/15  [id number] History   First MD Initiated Contact with Patient 11/18/15 [phone]     Chief Complaint  Patient presents with  . Generalized Body Aches    HPI Pt thinks she has th flu.  Sx started 3 days ago.  She has had cough, congestion, and headache.  She has not eaten much with decreased appetite.  She vomited once last night but that has not been a constant issue.  No diarrhea.  Has not measured her temp but has felt feverish.  Sweating a lot at night.  No dysuria or frequency.  Cough, productive of phlegm.  No flu shot this season.  History reviewed. No pertinent past medical history. History reviewed. No pertinent past surgical history. History reviewed. No pertinent family history. Social History  Substance Use Topics  . Smoking status: Never Smoker   . Smokeless tobacco: None  . Alcohol Use: Yes   OB History    No data available     Review of Systems  All other systems reviewed and are negative.     Allergies  Review of patient's allergies indicates no known allergies.  Home Medications   Prior to Admission medications   Medication Sig Start Date End Date Taking? Authorizing Provider  benzonatate (TESSALON) 100 MG capsule Take 1 capsule (100 mg total) by mouth every 8 (eight) hours. 11/18/15   Linwood DibblesJon Haruna Rohlfs, MD  potassium chloride (K-DUR) 10 MEQ tablet Take 1 tablet (10 mEq total) by mouth 2 (two) times daily. 12/24/13   Linwood DibblesJon Charonda Hefter, MD   BP 109/81 mmHg  Pulse 107  Temp(Src) 98.4 F (36.9 C) (Oral)  Resp 20  SpO2 100%  LMP 11/15/2015 Physical Exam  Constitutional: No distress.  HENT:  Head: Normocephalic and atraumatic.  Right Ear: External ear normal.  Left Ear: External ear normal.  Mouth/Throat: No oropharyngeal exudate or posterior oropharyngeal edema.  Mm dry  Eyes: Conjunctivae are normal. Right eye exhibits no discharge. Left eye exhibits no discharge. No scleral icterus.  Neck: Neck supple. No tracheal deviation  present.  Cardiovascular: Normal rate, regular rhythm and intact distal pulses.   Pulmonary/Chest: Effort normal and breath sounds normal. No stridor. No respiratory distress. She has no wheezes. She has no rales.  Abdominal: Soft. Bowel sounds are normal. She exhibits no distension. There is no tenderness. There is no rebound and no guarding.  Musculoskeletal: She exhibits no edema or tenderness.  Neurological: She is alert. She has normal strength. No cranial nerve deficit (no facial droop, extraocular movements intact, no slurred speech) or sensory deficit. She exhibits normal muscle tone. She displays no seizure activity. Coordination normal.  Skin: Skin is warm and dry. No rash noted.  Psychiatric: She has a normal mood and affect.  Nursing note and vitals reviewed.   ED Course  Procedures  Dg Chest 2 View  11/18/2015  CLINICAL DATA:  Body aches cough chest congestion for a few days, nonsmoker EXAM: CHEST  2 VIEW COMPARISON:  None. FINDINGS: The heart size and mediastinal contours are within normal limits. Both lungs are clear. The visualized skeletal structures are unremarkable. IMPRESSION: No active cardiopulmonary disease. Electronically Signed   By: Esperanza Heiraymond  Rubner M.D.   On: 11/18/2015 08:50   I have personally reviewed and evaluated these images and lab results as part of my medical decision-making.    MDM   Final diagnoses:  Influenza-like illness   Symptoms are consistent with an influenza like illness.  There is no evidence to suggest pneumonia on CXR.  I discussed supportive treatment. I encouraged followup with the primary care doctor next week if symptoms have not resolved. Warning signs and reasons to return to the emergency room were discussed    Linwood Dibbles, MD 11/18/15 [phone]

## 2015-11-18 NOTE — ED Notes (Signed)
Patient is alert and oriented x3.  She was given DC instructions and follow up visit instructions.  Patient gave verbal understanding. She was DC ambulatory under her own power to home.  V/S stable.  He was not showing any signs of distress on DC 

## 2015-11-18 NOTE — Discharge Instructions (Signed)

## 2015-11-18 NOTE — ED Notes (Signed)
Patient is alert and oriented x4.  She is complaining of generlized body aches with head congestion. Patient notes decreased appetite over the last 3 days.  Currently she rates her pain 7 of 10.

## 2016-10-20 ENCOUNTER — Ambulatory Visit: Payer: Self-pay | Admitting: Obstetrics

## 2018-09-02 ENCOUNTER — Other Ambulatory Visit: Payer: Self-pay

## 2018-09-02 ENCOUNTER — Ambulatory Visit (HOSPITAL_COMMUNITY)
Admission: EM | Admit: 2018-09-02 | Discharge: 2018-09-02 | Disposition: A | Discharge status: Home / Self Care | Payer: BLUE CROSS/BLUE SHIELD | Attending: Family Medicine | Admitting: Family Medicine

## 2018-09-02 ENCOUNTER — Encounter (HOSPITAL_COMMUNITY): Payer: Self-pay | Admitting: Emergency Medicine

## 2018-09-02 DIAGNOSIS — R109 Unspecified abdominal pain: Secondary | ICD-10-CM

## 2018-09-02 DIAGNOSIS — A084 Viral intestinal infection, unspecified: Secondary | ICD-10-CM | POA: Diagnosis not present

## 2018-09-02 LAB — POCT URINALYSIS DIP (DEVICE)
Glucose, UA: NEGATIVE mg/dL
Leukocytes, UA: NEGATIVE
Nitrite: NEGATIVE
Protein, ur: NEGATIVE mg/dL
Specific Gravity, Urine: >=1.03 (ref 1.005–1.030)
Urobilinogen, UA: 4 mg/dL — ABNORMAL HIGH (ref 0.0–1.0)
pH: 6 (ref 5.0–8.0)

## 2018-09-02 MED ORDER — ONDANSETRON 4 MG PO TBDP
ORAL_TABLET | ORAL | Status: AC
  Filled 2018-09-02: qty 1

## 2018-09-02 MED ORDER — ONDANSETRON 4 MG PO TBDP
4.0000 mg | ORAL_TABLET | Freq: Once | ORAL | Status: AC
  Administered 2018-09-02: 4 mg via ORAL

## 2018-09-02 MED ORDER — ONDANSETRON HCL 4 MG PO TABS: 4.0000 mg | ORAL_TABLET | Freq: Four times a day (QID) | ORAL | 0 refills | Status: DC

## 2018-09-02 NOTE — Discharge Instructions (Signed)
Push fluids Rest Take the zofran for nausea and vomiting Advance to bland food as tolerated Return if you do not improve with treatment

## 2018-09-02 NOTE — ED Provider Notes (Addendum)
[facility]    CSN: [id number] Arrival date & time: 09/02/18  [id number]     History   Chief Complaint Chief Complaint  Patient presents with  . Abdominal Cramping    HPI Kayla Brandt is a 32 y.o. female.   HPI  The patient states that she feels dehydrated.  She has had nausea and vomiting for the last 2 or 3 days.  She had a normal bowel movement day before yesterday.  She has some abdominal crampy pain.  Normal menstrual periods, no vaginal discharge or signs of infection, no no urinary symptoms or dysuria.  She states her urine is dark but she thinks this is because she has not been drinking enough.  When she tries to drink more than just sips of fluid she states she feels like she is going to vomit.  No known exposure to illness.  No recent travel.  No recent antibiotics.  History reviewed. No pertinent past medical history.  There are no active problems to display for this patient.   History reviewed. No pertinent surgical history.  OB History   No obstetric history on file.      Home Medications    Prior to Admission medications   Medication Sig Start Date End Date Taking? Authorizing Provider  ondansetron (ZOFRAN) 4 MG tablet Take 1 tablet (4 mg total) by mouth every 6 (six) hours. 09/02/18   Eustace MooreNelson, Braileigh Landenberger Sue, MD    Family History Family History  Problem Relation Age of Onset  . Hypertension Mother     Social History Social History   Tobacco Use  . Smoking status: Never Smoker  Substance Use Topics  . Alcohol use: Yes  . Drug use: Never     Allergies   Patient has no known allergies.   Review of Systems Review of Systems  Constitutional: Negative for chills and fever.  HENT: Negative for ear pain and sore throat.   Eyes: Negative for pain and visual disturbance.  Respiratory: Negative for cough and shortness of breath.   Cardiovascular: Negative for chest pain and palpitations.  Gastrointestinal: Positive for abdominal pain,  nausea and vomiting.  Genitourinary: Negative for dysuria, hematuria, vaginal bleeding and vaginal discharge.  Musculoskeletal: Positive for myalgias. Negative for arthralgias and back pain.  Skin: Negative for color change and rash.  Neurological: Negative for seizures and syncope.  All other systems reviewed and are negative.    Physical Exam Triage Vital Signs BP 130/79 (BP Location: Right Arm)   Pulse 100   Temp 98.4 F (36.9 C) (Oral)   Resp 18   LMP 08/22/2018   SpO2 96%      Pain Score 5     Pain Loc      Pain Edu?      Excl. in GC?    No data found.  Updated Vital Signs BP 130/79 (BP Location: Right Arm)   Pulse 100   Temp 98.4 F (36.9 C) (Oral)   Resp 18   LMP 08/22/2018   SpO2 96%       Physical Exam Constitutional:      General: She is not in acute distress.    Appearance: She is well-developed.  HENT:     Head: Normocephalic and atraumatic.     Mouth/Throat:     Mouth: Mucous membranes are dry.  Eyes:     Conjunctiva/sclera: Conjunctivae normal.     Pupils: Pupils are equal, round, and reactive to light.  Neck:  Musculoskeletal: Normal range of motion and neck supple.  Cardiovascular:     Rate and Rhythm: Normal rate and regular rhythm.     Heart sounds: Normal heart sounds.  Pulmonary:     Effort: Pulmonary effort is normal. No respiratory distress.     Breath sounds: Normal breath sounds.  Abdominal:     General: Abdomen is flat. Bowel sounds are normal. There is no distension.     Palpations: Abdomen is soft.     Tenderness: There is no abdominal tenderness. There is no guarding or rebound.  Musculoskeletal: Normal range of motion.  Lymphadenopathy:     Cervical: No cervical adenopathy.  Skin:    General: Skin is warm and dry.  Neurological:     General: No focal deficit present.     Mental Status: She is alert. Mental status is at baseline.  Psychiatric:        Mood and Affect: Mood normal.        Thought Content: Thought  content normal.      UC Treatments / Results  Labs (all labs ordered are listed, but only abnormal results are displayed) Labs Reviewed  POCT URINALYSIS DIP (DEVICE) - Abnormal; Notable for the following components:      Result Value   Bilirubin Urine SMALL (*)    Ketones, ur >=160 (*)    Hgb urine dipstick TRACE (*)    Urobilinogen, UA 4.0 (*)    All other components within normal limits    EKG None  Radiology No results found.  Procedures Procedures (including critical care time)  Medications Ordered in UC Medications  ondansetron (ZOFRAN-ODT) disintegrating tablet 4 mg (4 mg Oral Given 09/02/18 1124)    Initial Impression / Assessment and Plan / UC Course  I have reviewed the triage vital signs and the nursing notes.  Pertinent labs & imaging results that were available during my care of the patient were reviewed by me and considered in my medical decision making (see chart for details).     Patient likely has a viral gastroenteritis.  Will treat with Zofran and oral rehydration.  Discussed advancing to regular diet.  Return if fails to improve over the next couple of days. Final Clinical Impressions(s) / UC Diagnoses   Final diagnoses:  Viral gastroenteritis     Discharge Instructions     Push fluids Rest Take the zofran for nausea and vomiting Advance to bland food as tolerated Return if you do not improve with treatment    ED Prescriptions    Medication Sig Dispense Auth. Provider   ondansetron (ZOFRAN) 4 MG tablet Take 1 tablet (4 mg total) by mouth every 6 (six) hours. 12 tablet Eustace MooreNelson, Anett Ranker Sue, MD     Controlled Substance Prescriptions [place] Controlled Substance Registry consulted? Not Applicable   Eustace MooreNelson, Theressa Piedra Sue, MD 09/02/18 1234    Eustace MooreNelson, Shonique Pelphrey Sue, MD 09/02/18 1325

## 2018-09-02 NOTE — ED Notes (Signed)
Instructed to put on gown on return to treatment room from bathroom

## 2018-09-02 NOTE — ED Notes (Signed)
Gatorade given to pt. Reminded pt to sip and not drink quickly

## 2018-09-02 NOTE — ED Triage Notes (Signed)
Complains of abdominal cramping for 2 days Night sweats for 4 days Also concerned for hands cramping for 2 days Has minimal nausea, no vomiting, no diarrhea.  Last bm was Tuesday-normal for patient.  Cramping is in lower left abdomen.  Denies pain with urination.  Denies vaginald ischarge

## 2018-11-23 ENCOUNTER — Encounter (HOSPITAL_COMMUNITY): Payer: Self-pay

## 2018-11-23 ENCOUNTER — Ambulatory Visit (HOSPITAL_COMMUNITY)
Admission: EM | Admit: 2018-11-23 | Discharge: 2018-11-23 | Disposition: A | Discharge status: Home / Self Care | Payer: BLUE CROSS/BLUE SHIELD | Attending: Family Medicine | Admitting: Family Medicine

## 2018-11-23 DIAGNOSIS — K0889 Other specified disorders of teeth and supporting structures: Secondary | ICD-10-CM | POA: Diagnosis not present

## 2018-11-23 DIAGNOSIS — K029 Dental caries, unspecified: Secondary | ICD-10-CM

## 2018-11-23 MED ORDER — AMOXICILLIN-POT CLAVULANATE 875-125 MG PO TABS
1.0000 | ORAL_TABLET | Freq: Two times a day (BID) | ORAL | 0 refills | Status: AC
Start: 2018-11-23 — End: 2018-12-03

## 2018-11-23 MED ORDER — LIDOCAINE VISCOUS HCL 2 % MT SOLN: 15.0000 mL | OROMUCOSAL | 0 refills | Status: DC | PRN

## 2018-11-23 MED ORDER — MELOXICAM 7.5 MG PO TABS: 7.5000 mg | ORAL_TABLET | Freq: Every day | ORAL | 0 refills | Status: DC

## 2018-11-23 NOTE — ED Triage Notes (Signed)
Pt c/o rt lower toothache since Sunday night

## 2018-11-23 NOTE — ED Provider Notes (Signed)
[facility]   [id number] 11/23/18 Arrival Time: 1711  CC: DENTAL PAIN  SUBJECTIVE:  Kayla Brandt is a 33 y.o. female who presents with RT lower dental pain x 2 days.  Denies a precipitating event or trauma.  Localizes pain to RT lower side.  Has tried OTC analgesics without relief.  Worse with chewing.  Does not see a dentist regularly.  Reports similar symptoms in the past.  Denies fever, chills, dysphagia, odynophagia, oral or neck swelling, nausea, vomiting, chest pain, SOB.    ROS: As per HPI.  History reviewed. No pertinent past medical history. History reviewed. No pertinent surgical history. No Known Allergies No current facility-administered medications on file prior to encounter.    No current outpatient medications on file prior to encounter.   Social History   Socioeconomic History  . Marital status: Single    Spouse name: Not on file  . Number of children: Not on file  . Years of education: Not on file  . Highest education level: Not on file  Occupational History  . Not on file  Social Needs  . Financial resource strain: Not on file  . Food insecurity:    Worry: Not on file    Inability: Not on file  . Transportation needs:    Medical: Not on file    Non-medical: Not on file  Tobacco Use  . Smoking status: Never Smoker  . Smokeless tobacco: Never Used  Substance and Sexual Activity  . Alcohol use: Yes  . Drug use: Never  . Sexual activity: Yes    Birth control/protection: None  Lifestyle  . Physical activity:    Days per week: Not on file    Minutes per session: Not on file  . Stress: Not on file  Relationships  . Social connections:    Talks on phone: Not on file    Gets together: Not on file    Attends religious service: Not on file    Active member of club or organization: Not on file    Attends meetings of clubs or organizations: Not on file    Relationship status: Not on file  . Intimate partner violence:    Fear of current  or ex partner: Not on file    Emotionally abused: Not on file    Physically abused: Not on file    Forced sexual activity: Not on file  Other Topics Concern  . Not on file  Social History Narrative  . Not on file   Family History  Problem Relation Age of Onset  . Hypertension Mother     OBJECTIVE:  Vitals:   11/23/18 1745  BP: 119/82  Pulse: (!) 108  Resp: 18  Temp: 98 F (36.7 C)  TempSrc: Oral  SpO2: 100%    General appearance: alert; no distress HENT: normocephalic; atraumatic; dentition: fair; caries over right lower gums without areas of fluctuance Neck: supple without LAD Lungs: normal respirations Skin: warm and dry Psychological: alert and cooperative; normal mood and affect  ASSESSMENT & PLAN:  1. Pain, dental   2. Dental caries     Meds ordered this encounter  Medications  . amoxicillin-clavulanate (AUGMENTIN) 875-125 MG tablet    Sig: Take 1 tablet by mouth every 12 (twelve) hours for 10 days.    Dispense:  20 tablet    Refill:  0    Order Specific Question:   Supervising Provider    Answer:   Eustace Moore [[id number]]  . meloxicam (  MOBIC) 7.5 MG tablet    Sig: Take 1 tablet (7.5 mg total) by mouth daily.    Dispense:  20 tablet    Refill:  0    Order Specific Question:   Supervising Provider    Answer:   Eustace Moore [[id number]]  . lidocaine (XYLOCAINE) 2 % solution    Sig: Use as directed 15 mLs in the mouth or throat as needed for mouth pain.    Dispense:  100 mL    Refill:  0    Order Specific Question:   Supervising Provider    Answer:   Eustace Moore [[id number]]   Augmentin prescribed.  Take as directed and to completion Mobic prescribed.  Take as directed.  DO NOT TAKE WITH OTHER ANTIINFLAMMATORIES as this may cause GI upset and stomach irritation Viscous lidocaine prescribed.  This is an oral solution you can swish, gargle, and/or swallow as needed for symptomatic relief of sore throat.  Do not exceed 8 doses in a 24 hour  period.  Do not use prior to eating, as this will numb your entire mouth.    Recommend soft diet until evaluated by dentist Maintain oral hygiene care Follow up with dentist as soon as possible for further evaluation and treatment  Return or go to the ED if you have any new or worsening symptoms such as fever, chills, difficulty swallowing, painful swallowing, oral or neck swelling, nausea, vomiting, chest pain, SOB, etc...   Reviewed expectations re: course of current medical issues. Questions answered. Outlined signs and symptoms indicating need for more acute intervention. Patient verbalized understanding. After Visit Summary given.   Rennis Harding, PA-C 11/23/18 1926

## 2018-11-23 NOTE — Discharge Instructions (Addendum)
Augmentin prescribed.  Take as directed and to completion Mobic prescribed.  Take as directed.  DO NOT TAKE WITH OTHER ANTIINFLAMMATORIES as this may cause GI upset and stomach irritation Viscous lidocaine prescribed.  This is an oral solution you can swish, gargle, and/or swallow as needed for symptomatic relief of sore throat.  Do not exceed 8 doses in a 24 hour period.  Do not use prior to eating, as this will numb your entire mouth.    Recommend soft diet until evaluated by dentist Maintain oral hygiene care Follow up with dentist as soon as possible for further evaluation and treatment  Return or go to the ED if you have any new or worsening symptoms such as fever, chills, difficulty swallowing, painful swallowing, oral or neck swelling, nausea, vomiting, chest pain, SOB, etc..Marland Kitchen

## 2018-12-12 ENCOUNTER — Encounter (HOSPITAL_COMMUNITY): Payer: Self-pay

## 2018-12-12 ENCOUNTER — Ambulatory Visit (HOSPITAL_COMMUNITY)
Admission: EM | Admit: 2018-12-12 | Discharge: 2018-12-12 | Disposition: A | Discharge status: Home / Self Care | Payer: BLUE CROSS/BLUE SHIELD | Attending: Family Medicine | Admitting: Family Medicine

## 2018-12-12 ENCOUNTER — Other Ambulatory Visit: Payer: Self-pay

## 2018-12-12 DIAGNOSIS — X500XXA Overexertion from strenuous movement or load, initial encounter: Secondary | ICD-10-CM | POA: Diagnosis not present

## 2018-12-12 DIAGNOSIS — R0789 Other chest pain: Secondary | ICD-10-CM | POA: Diagnosis not present

## 2018-12-12 DIAGNOSIS — S29011A Strain of muscle and tendon of front wall of thorax, initial encounter: Secondary | ICD-10-CM | POA: Diagnosis not present

## 2018-12-12 MED ORDER — NAPROXEN 500 MG PO TABS: 500.0000 mg | ORAL_TABLET | Freq: Two times a day (BID) | ORAL | 0 refills | Status: DC

## 2018-12-12 NOTE — ED Provider Notes (Signed)
[facility]   [id number] 12/12/18 Arrival Time: 1152   CC: CHEST discomfort  SUBJECTIVE:  Kayla Brandt is a 33 y.o. female who presents with complaint of gradual chest discomfort that began this morning. 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 Denies recent travel or positive sick exposure. Localizes chest pain to the RT side of chest.  Describes as stable, that is intermittent (with episodes lasting approximately 3 minutes) and "tightening" in character.  Rates pain as 6/10.   Has NOT tried OTC medications.  Symptoms made worse with looking over right shoulder and taking a deep breath.  Denies radiating symptoms.  Reports previous symptoms in the past that resolved without treatment.  Denies fever, chills, lightheadedness, dizziness, palpitations, tachycardia, SOB, nausea, vomiting, abdominal pain, changes in bowel or bladder habits, diaphoresis, numbness/tingling in extremities, peripheral edema, or anxiety.    Denies SOB, calf pain or swelling, recent long travel, recent surgery, pregnancy, malignancy, tobacco use, hormone use, or previous blood clot  Denies close relatives with cardiac hx  ROS: As per HPI. History reviewed. No pertinent past medical history. History reviewed. No pertinent surgical history. No Known Allergies No current facility-administered medications on file prior to encounter.    No current outpatient medications on file prior to encounter.   Social History   Socioeconomic History  . Marital status: Single    Spouse name: Not on file  . Number of children: Not on file  . Years of education: Not on file  . Highest education level: Not on file  Occupational History  . Not on file  Social Needs  . Financial resource strain: Not on file  . Food insecurity:    Worry: Not on file    Inability: Not on file  . Transportation needs:    Medical: Not on file    Non-medical: Not on file  Tobacco Use  . Smoking status: Never  Smoker  . Smokeless tobacco: Never Used  Substance and Sexual Activity  . Alcohol use: Yes  . Drug use: Never  . Sexual activity: Yes    Birth control/protection: None  Lifestyle  . Physical activity:    Days per week: Not on file    Minutes per session: Not on file  . Stress: Not on file  Relationships  . Social connections:    Talks on phone: Not on file    Gets together: Not on file    Attends religious service: Not on file    Active member of club or organization: Not on file    Attends meetings of clubs or organizations: Not on file    Relationship status: Not on file  . Intimate partner violence:    Fear of current or ex partner: Not on file    Emotionally abused: Not on file    Physically abused: Not on file    Forced sexual activity: Not on file  Other Topics Concern  . Not on file  Social History Narrative  . Not on file   Family History  Problem Relation Age of Onset  . Hypertension Mother      OBJECTIVE:  Vitals:   12/12/18 1300 12/12/18 1302  BP:  118/84  Pulse:  86  Resp:  18  Temp:  98.4 F (36.9 C)  SpO2:  99%  Weight: 180 lb (81.6 kg)     General appearance: alert; no distress Eyes: PERRLA; EOMI; conjunctiva normal HENT: normocephalic; atraumatic Neck: supple Lungs: clear to auscultation  bilaterally without adventitious breath sounds Heart: regular rate and rhythm.  Clear S1 and S2 without rubs, gallops, or murmur. Chest Wall: TTP over RT superior lateral chest; NTTP with lateral or AP compression Abdomen: soft, non-tender; bowel sounds normal; no guarding Extremities: no cyanosis or edema; symmetrical with no gross deformities Skin: warm and dry Psychological: alert and cooperative; normal mood and affect  ECG: Orders placed or performed during the hospital encounter of 12/12/18  . EKG 12-Lead  . EKG 12-Lead    EKG normal sinus rhythm without ST elevations, depressions, or prolonged PR interval.  No narrowing or widening of the QRS  complexes.  Reviewed EKG with Dr. Delton See.    ASSESSMENT & PLAN:  1. Chest wall pain   2. Pectoralis muscle strain, initial encounter     Meds ordered this encounter  Medications  . naproxen (NAPROSYN) 500 MG tablet    Sig: Take 1 tablet (500 mg total) by mouth 2 (two) times daily.    Dispense:  30 tablet    Refill:  0    Order Specific Question:   Supervising Provider    Answer:   Eustace Moore [[id number]]   Symptoms and physical exam consistent with muscle strain EKG was unremarkable.  Reviewed with Dr. Delton See.   Continue conservative management of rest, ice, and gentle stretches Take naproxen as needed for pain relief (may cause abdominal discomfort, ulcers, and GI bleeds avoid taking with other NSAIDs) Follow up with PCP or with community health if symptoms persist Present to ER if worsening or new symptoms (fever, chills, chest pain, shortness of breath, abdominal pain, changes in bowel or bladder habits, pain radiating into lower legs, etc...)   Chest pain precautions given. Reviewed expectations re: course of current medical issues. Questions answered. Outlined signs and symptoms indicating need for more acute intervention. Patient verbalized understanding. After Visit Summary given.   Rennis Harding, PA-C 12/12/18 1348

## 2018-12-12 NOTE — Discharge Instructions (Signed)
Symptoms and physical exam consistent with muscle strain EKG was unremarkable.  Reviewed with Dr. Delton See.   Continue conservative management of rest, ice, and gentle stretches Take naproxen as needed for pain relief (may cause abdominal discomfort, ulcers, and GI bleeds avoid taking with other NSAIDs) Follow up with PCP or with community health if symptoms persist Present to ER if worsening or new symptoms (fever, chills, chest pain, shortness of breath, abdominal pain, changes in bowel or bladder habits, pain radiating into lower legs, etc...)

## 2018-12-12 NOTE — ED Triage Notes (Signed)
Pt cc chest discomfort and SOB x 2 days. Pt states she woke up like this today as well.

## 2018-12-27 ENCOUNTER — Other Ambulatory Visit: Payer: Self-pay

## 2018-12-27 ENCOUNTER — Ambulatory Visit (HOSPITAL_COMMUNITY)
Admission: EM | Admit: 2018-12-27 | Discharge: 2018-12-27 | Disposition: A | Discharge status: Home / Self Care | Payer: BLUE CROSS/BLUE SHIELD | Attending: Family Medicine | Admitting: Family Medicine

## 2018-12-27 ENCOUNTER — Encounter (HOSPITAL_COMMUNITY): Payer: Self-pay

## 2018-12-27 DIAGNOSIS — Z3201 Encounter for pregnancy test, result positive: Secondary | ICD-10-CM

## 2018-12-27 DIAGNOSIS — Z3491 Encounter for supervision of normal pregnancy, unspecified, first trimester: Secondary | ICD-10-CM

## 2018-12-27 LAB — POCT URINALYSIS DIP (DEVICE)
Glucose, UA: NEGATIVE mg/dL
Leukocytes,Ua: NEGATIVE
Nitrite: NEGATIVE
Protein, ur: NEGATIVE mg/dL
Specific Gravity, Urine: 1.025 (ref 1.005–1.030)
Urobilinogen, UA: 1 mg/dL (ref 0.0–1.0)
pH: 6.5 (ref 5.0–8.0)

## 2018-12-27 LAB — POCT PREGNANCY, URINE: Preg Test, Ur: POSITIVE — AB

## 2018-12-27 MED ORDER — PRENATAL VITAMIN 27-0.8 MG PO TABS: 1.0000 | ORAL_TABLET | ORAL | 3 refills | Status: AC

## 2018-12-27 NOTE — ED Triage Notes (Signed)
Positive pregnancy test 2 weeks ago, began spotting and cramping on Saturday

## 2018-12-27 NOTE — ED Provider Notes (Signed)
[facility]    CSN: [id number] Arrival date & time: 12/27/18  [id number]     History   Chief Complaint No chief complaint on file.   HPI Kayla Brandt is a 33 y.o. female.   33 yo woman with LMP last month who took home pregnancy test and has had some spotting.  No cramps, fever, cough, headache.  G4P4.  Would like confirmatory test.  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     History reviewed. No pertinent past medical history.  There are no active problems to display for this patient.   History reviewed. No pertinent surgical history.  OB History   No obstetric history on file.      Home Medications    Prior to Admission medications   Medication Sig Start Date End Date Taking? Authorizing Provider  Prenatal Vit-Fe Fumarate-FA (PRENATAL VITAMIN) 27-0.8 MG TABS Take 1 tablet by mouth 1 day or 1 dose for 1 dose. 12/27/18 12/28/18  Elvina SidleLauenstein, Alfio Loescher, MD    Family History Family History  Problem Relation Age of Onset  . Hypertension Mother     Social History Social History   Tobacco Use  . Smoking status: Never Smoker  . Smokeless tobacco: Never Used  Substance Use Topics  . Alcohol use: Yes  . Drug use: Never     Allergies   Patient has no known allergies.   Review of Systems Review of Systems  Genitourinary: Positive for vaginal bleeding.  All other systems reviewed and are negative.    Physical Exam Triage Vital Signs ED Triage Vitals  Enc Vitals Group     BP 12/27/18 0924 123/88     Pulse Rate 12/27/18 0924 97     Resp 12/27/18 0924 18     Temp 12/27/18 0924 98.3 F (36.8 C)     Temp src --      SpO2 12/27/18 0924 99 %     Weight --      Height --      Head Circumference --      Peak Flow --      Pain Score 12/27/18 0925 0     Pain Loc --      Pain Edu? --      Excl. in GC? --    No data found.  Updated Vital Signs BP 123/88   Pulse 97   Temp 98.3 F (36.8 C)    Resp 18   LMP 11/30/2018   SpO2 99%    Physical Exam Vitals signs and nursing note reviewed.  Constitutional:      Appearance: Normal appearance.  HENT:     Head: Normocephalic.     Mouth/Throat:     Mouth: Mucous membranes are moist.  Neck:     Musculoskeletal: Normal range of motion and neck supple.  Pulmonary:     Effort: Pulmonary effort is normal.  Abdominal:     Tenderness: There is no abdominal tenderness.  Musculoskeletal: Normal range of motion.  Skin:    General: Skin is warm and dry.  Neurological:     General: No focal deficit present.     Mental Status: She is alert.      UC Treatments / Results  Labs (all labs ordered are listed, but only abnormal results are displayed) Labs Reviewed  POCT URINALYSIS DIP (DEVICE) - Abnormal; Notable for the following components:      Result Value  Bilirubin Urine SMALL (*)    Ketones, ur TRACE (*)    Hgb urine dipstick LARGE (*)    All other components within normal limits  POCT PREGNANCY, URINE - Abnormal; Notable for the following components:   Preg Test, Ur POSITIVE (*)    All other components within normal limits    EKG None  Radiology No results found.  Procedures Procedures (including critical care time)  Medications Ordered in UC Medications - No data to display  Initial Impression / Assessment and Plan / UC Course  I have reviewed the triage vital signs and the nursing notes.  Pertinent labs & imaging results that were available during my care of the patient were reviewed by me and considered in my medical decision making (see chart for details).    Final Clinical Impressions(s) / UC Diagnoses   Final diagnoses:  Normal pregnancy in first trimester     Discharge Instructions     You are pregnant    ED Prescriptions    Medication Sig Dispense Auth. Provider   Prenatal Vit-Fe Fumarate-FA (PRENATAL VITAMIN) 27-0.8 MG TABS Take 1 tablet by mouth 1 day or 1 dose for 1 dose. 90 tablet  Elvina Sidle, MD     Controlled Substance Prescriptions [place] Controlled Substance Registry consulted? Not Applicable   Elvina Sidle, MD 12/27/18 1017

## 2018-12-27 NOTE — Discharge Instructions (Signed)
You are pregnant

## 2019-09-16 NOTE — L&D Delivery Note (Addendum)
Delivery Note Labor onset:   05/31/20 Labor Onset Time: 0630 Complete dilation at 6:30 PM  Onset of pushing at 1832 FHR second stage Cat 1 Analgesia/Anesthesia intrapartum: Epidural  Guided pushing with maternal urge. Delivery of a viable female at 60. Fetal head delivered in OA position. Nuchal cord none. Infant placed on maternal abd, dried, and tactile stim.  Cord double clamped after 1 min and cut by Everlene Other, father. Father present for birth. Cord blood sample collected yes Arterial cord blood sample N/A.  Placenta delivered Tomasa Blase, intact, with 3 VC.  Placenta to L&D. Uterine tone firm bleeding minimal  No laceration identified.  Repair: N/A QBL/EBL (mL): 100 Complications: none APGAR: APGAR (1 MIN): 9   APGAR (5 MINS): 9   APGAR (10 MINS):   Mom to postpartum.  Baby to Couplet care / Skin to Skin.  Plans PP BTL NPO after midnight Epidural cath to remain in place  [name] MSN, CNM 05/31/2020, 7:04 PM

## 2019-10-07 ENCOUNTER — Encounter (HOSPITAL_COMMUNITY): Payer: Self-pay | Admitting: Emergency Medicine

## 2019-10-07 ENCOUNTER — Ambulatory Visit (HOSPITAL_COMMUNITY)
Admission: EM | Admit: 2019-10-07 | Discharge: 2019-10-07 | Disposition: A | Discharge status: Home / Self Care | Payer: Medicaid Other | Attending: Family Medicine | Admitting: Family Medicine

## 2019-10-07 ENCOUNTER — Other Ambulatory Visit: Payer: Self-pay

## 2019-10-07 DIAGNOSIS — Z3201 Encounter for pregnancy test, result positive: Secondary | ICD-10-CM | POA: Diagnosis not present

## 2019-10-07 DIAGNOSIS — R000 Tachycardia, unspecified: Secondary | ICD-10-CM | POA: Diagnosis not present

## 2019-10-07 DIAGNOSIS — Z20822 Contact with and (suspected) exposure to covid-19: Secondary | ICD-10-CM | POA: Insufficient documentation

## 2019-10-07 DIAGNOSIS — R112 Nausea with vomiting, unspecified: Secondary | ICD-10-CM

## 2019-10-07 LAB — POCT PREGNANCY, URINE: Preg Test, Ur: POSITIVE — AB

## 2019-10-07 LAB — POCT URINALYSIS DIP (DEVICE)
Glucose, UA: NEGATIVE mg/dL
Ketones, ur: 80 mg/dL — AB
Leukocytes,Ua: NEGATIVE
Nitrite: NEGATIVE
Protein, ur: 30 mg/dL — AB
Specific Gravity, Urine: >=1.03 (ref 1.005–1.030)
Urobilinogen, UA: 0.2 mg/dL (ref 0.0–1.0)
pH: 6 (ref 5.0–8.0)

## 2019-10-07 LAB — POC URINE PREG, ED: Preg Test, Ur: POSITIVE — AB

## 2019-10-07 MED ORDER — PRENATAL ADULT GUMMY/DHA/FA 0.4-25 MG PO CHEW: 1.0000 | CHEWABLE_TABLET | Freq: Every day | ORAL | 0 refills | Status: DC

## 2019-10-07 MED ORDER — ONDANSETRON 4 MG PO TBDP: 4.0000 mg | ORAL_TABLET | Freq: Three times a day (TID) | ORAL | 0 refills | Status: DC | PRN

## 2019-10-07 MED ORDER — DOXYLAMINE-PYRIDOXINE 10-10 MG PO TBEC: 2.0000 | DELAYED_RELEASE_TABLET | Freq: Every day | ORAL | 0 refills | Status: DC

## 2019-10-07 NOTE — ED Provider Notes (Addendum)
Kayla Brandt    CSN: [id number] Arrival date & time: 10/07/19  5732      History   Chief Complaint Chief Complaint  Patient presents with  . Emesis    HPI Kayla Brandt is a 34 y.o. female no significant past medical history presenting today for evaluation of nausea and vomiting and positive pregnancy test.  Patient states that she took a pregnancy test yesterday at home which was positive.  She notes that over the past 2 days she has had a lot of nausea and vomiting.  She has had poor oral intake.  Today she has been able to keep down some fluids.  Denies dizziness or lightheadedness.  Denies any abdominal pain, diarrhea, dysuria, increased frequency or urgency.  She denies any abnormal bleeding.  States that her menstrual cycles are typically irregular, notes that she had a cycle and December, but is unsure exactly when.  She is not on any form of birth control.  Has had 4 prior pregnancies without complications.  She has had issues with dehydration in the past.  Denies URI symptoms.  Denies any known exposure to Covid.  Denies fevers.  HPI  History reviewed. No pertinent past medical history.  There are no problems to display for this patient.   History reviewed. No pertinent surgical history.  OB History   No obstetric history on file.      Home Medications    Prior to Admission medications   Medication Sig Start Date End Date Taking? Authorizing Provider  Doxylamine-Pyridoxine 10-10 MG TBEC Take 2 tablets by mouth at bedtime. 10/07/19   Nikeshia Keetch C, PA-C  ondansetron (ZOFRAN ODT) 4 MG disintegrating tablet Take 1 tablet (4 mg total) by mouth every 8 (eight) hours as needed for nausea or vomiting. 10/07/19   Jennifermarie Franzen, Elesa Hacker, PA-C  Prenatal MV & Min w/FA-DHA (PRENATAL ADULT GUMMY/DHA/FA) 0.4-25 MG CHEW Chew 1 each by mouth daily. 10/07/19   Roben Tatsch, Elesa Hacker, PA-C    Family History Family History  Problem Relation Age of Onset  . Hypertension Mother      Social History Social History   Tobacco Use  . Smoking status: Never Smoker  . Smokeless tobacco: Never Used  Substance Use Topics  . Alcohol use: Yes  . Drug use: Never     Allergies   Patient has no known allergies.   Review of Systems Review of Systems  Constitutional: Negative for activity change, appetite change, chills, fatigue and fever.  HENT: Negative for congestion, ear pain, rhinorrhea, sinus pressure, sore throat and trouble swallowing.   Eyes: Negative for discharge and redness.  Respiratory: Negative for cough, chest tightness and shortness of breath.   Cardiovascular: Negative for chest pain.  Gastrointestinal: Positive for nausea and vomiting. Negative for abdominal pain and diarrhea.  Musculoskeletal: Negative for myalgias.  Skin: Negative for rash.  Neurological: Negative for dizziness, light-headedness and headaches.     Physical Exam Triage Vital Signs ED Triage Vitals  Enc Vitals Group     BP 10/07/19 0941 120/88     Pulse Rate 10/07/19 0941 (!) 120     Resp 10/07/19 0941 20     Temp 10/07/19 0941 99.1 F (37.3 C)     Temp src --      SpO2 10/07/19 0941 100 %     Weight --      Height --      Head Circumference --      Peak Flow --  Pain Score 10/07/19 0943 0     Pain Loc --      Pain Edu? --      Excl. in GC? --    No data found.  Updated Vital Signs BP 120/88   Pulse (!) 120   Temp 99.1 F (37.3 C)   Resp 20   SpO2 100%   Visual Acuity Right Eye Distance:   Left Eye Distance:   Bilateral Distance:    Right Eye Near:   Left Eye Near:    Bilateral Near:     Physical Exam Vitals and nursing note reviewed.  Constitutional:      General: She is not in acute distress.    Appearance: She is well-developed.     Comments: Sitting comfortably on exam table, in no acute distress  HENT:     Head: Normocephalic and atraumatic.     Mouth/Throat:     Comments: Oral mucosa pink and moist, no tonsillar enlargement or  exudate. Posterior pharynx patent and nonerythematous, no uvula deviation or swelling. Normal phonation.  Eyes:     Conjunctiva/sclera: Conjunctivae normal.  Cardiovascular:     Rate and Rhythm: Regular rhythm. Tachycardia present.     Heart sounds: No murmur.  Pulmonary:     Effort: Pulmonary effort is normal. No respiratory distress.     Breath sounds: Normal breath sounds.     Comments: Breathing comfortably at rest, CTABL, no wheezing, rales or other adventitious sounds auscultated Abdominal:     Palpations: Abdomen is soft.     Tenderness: There is no abdominal tenderness.     Comments: Soft, nondistended, nontender to light deep palpation throughout abdomen  Musculoskeletal:     Cervical back: Neck supple.  Skin:    General: Skin is warm and dry.  Neurological:     Mental Status: She is alert.      UC Treatments / Results  Labs (all labs ordered are listed, but only abnormal results are displayed) Labs Reviewed  POCT PREGNANCY, URINE - Abnormal; Notable for the following components:      Result Value   Preg Test, Ur POSITIVE (*)    All other components within normal limits  POC URINE PREG, ED - Abnormal; Notable for the following components:   Preg Test, Ur POSITIVE (*)    All other components within normal limits  POCT URINALYSIS DIP (DEVICE) - Abnormal; Notable for the following components:   Bilirubin Urine SMALL (*)    Ketones, ur 80 (*)    Hgb urine dipstick TRACE (*)    Protein, ur 30 (*)    All other components within normal limits  NOVEL CORONAVIRUS, NAA (HOSP ORDER, SEND-OUT TO REF LAB; TAT 18-24 HRS)    EKG   Radiology No results found.  Procedures Procedures (including critical care time)  Medications Ordered in UC Medications - No data to display  Initial Impression / Assessment and Plan / UC Course  I have reviewed the triage vital signs and the nursing notes.  Pertinent labs & imaging results that were available during my care of the  patient were reviewed by me and considered in my medical decision making (see chart for details).     Pregnancy test positive.  Patient continues to be tachycardic and UA consistent with mild dehydration.  Is tolerating PO fluids at this time, will initiate on Diclegis and oral rehydration trial at home.  Zofran as 2nd line.  Slowly transition diet.  If not having any improvement in  fluid tolerance or persistent vomiting over the next few days to follow-up as may need fluids.  Covid PCR pending.  Initiate prenatal.  Follow-up with OB/GYN.  Discussed strict return precautions. Patient verbalized understanding and is agreeable with plan.  Final Clinical Impressions(s) / UC Diagnoses   Final diagnoses:  Positive pregnancy test  Non-intractable vomiting with nausea, unspecified vomiting type     Discharge Instructions     Covid swab pending, please monitor my chart for results, quarantine until results return Please try using Diclegis and focus on fluids, as fluids are staying down slowly transition diet to bland diet, avoid spicy and greasy  Diclegis: Two tablets at bedtime on day 1 and 2; if symptoms persist, take 1 tablet in morning and 2 tablets at bedtime on day 3; if symptoms persist, may increase to 1 tablet in morning, 1 tablet mid-afternoon, and 2 tablets at bedtime on day 4 (maximum: doxylamine 40 mg/pyridoxine 40 mg (4 tablets) per day). OR One-half of the 25 mg Unisom sleep tablet over-the-counter tablet or two chewable 5 mg tablets can be used off-label as an antiemetic. In addition, pyridoxine 25 mg, also available over-the-counter, is taken three or four times per day;This is a reasonable, less expensive substitute for combination tablets.  If diclegis not helping please try zofran- dissolves under tongue  Begin prenatal Follow up with women's clinic  If you develop persistent nausea/vomiting, dizziness, lightheadedness please follow-up here emergency room, if you develop  abdominal pain and abnormal bleeding please follow-up at [facility]    ED Prescriptions    Medication Sig Dispense Auth. Provider   Doxylamine-Pyridoxine 10-10 MG TBEC Take 2 tablets by mouth at bedtime. 60 tablet Batul Diego C, PA-C   ondansetron (ZOFRAN ODT) 4 MG disintegrating tablet Take 1 tablet (4 mg total) by mouth every 8 (eight) hours as needed for nausea or vomiting. 20 tablet Kimmy Parish C, PA-C   Prenatal MV & Min w/FA-DHA (PRENATAL ADULT GUMMY/DHA/FA) 0.4-25 MG CHEW Chew 1 each by mouth daily. 60 tablet Sara Keys, Pollard C, PA-C     PDMP not reviewed this encounter.   Lew Dawes, PA-C 10/07/19 1034    Madhuri Vacca, [place] C, PA-C 10/07/19 1035

## 2019-10-07 NOTE — Discharge Instructions (Addendum)
Covid swab pending, please monitor my chart for results, quarantine until results return Please try using Diclegis and focus on fluids, as fluids are staying down slowly transition diet to bland diet, avoid spicy and greasy  Diclegis: Two tablets at bedtime on day 1 and 2; if symptoms persist, take 1 tablet in morning and 2 tablets at bedtime on day 3; if symptoms persist, may increase to 1 tablet in morning, 1 tablet mid-afternoon, and 2 tablets at bedtime on day 4 (maximum: doxylamine 40 mg/pyridoxine 40 mg (4 tablets) per day). OR One-half of the 25 mg Unisom sleep tablet over-the-counter tablet or two chewable 5 mg tablets can be used off-label as an antiemetic. In addition, pyridoxine 25 mg, also available over-the-counter, is taken three or four times per day;This is a reasonable, less expensive substitute for combination tablets.  If diclegis not helping please try zofran- dissolves under tongue  Begin prenatal Follow up with women's clinic  If you develop persistent nausea/vomiting, dizziness, lightheadedness please follow-up here emergency room, if you develop abdominal pain and abnormal bleeding please follow-up at [facility]

## 2019-10-07 NOTE — ED Triage Notes (Signed)
Pt states she took a pregnancy test yesterday and it was positive. States she's been dry heaving, has been vomiting a lot. Denies nausea at this time, states sometimes she feels queasy. Pt c/o feeling dehydrated.

## 2019-10-07 NOTE — ED Notes (Signed)
Pt given Gatorade, states she's able to drink without vomiting.

## 2019-10-09 LAB — NOVEL CORONAVIRUS, NAA (HOSP ORDER, SEND-OUT TO REF LAB; TAT 18-24 HRS): SARS-CoV-2, NAA: NOT DETECTED

## 2019-10-18 ENCOUNTER — Other Ambulatory Visit: Payer: Self-pay

## 2019-10-18 ENCOUNTER — Encounter (HOSPITAL_COMMUNITY): Payer: Self-pay | Admitting: Family Medicine

## 2019-10-18 ENCOUNTER — Inpatient Hospital Stay (HOSPITAL_COMMUNITY)
Admission: AD | Admit: 2019-10-18 | Discharge: 2019-10-18 | Disposition: A | Discharge status: Home / Self Care | Payer: Medicaid Other | Attending: Family Medicine | Admitting: Family Medicine

## 2019-10-18 DIAGNOSIS — O26891 Other specified pregnancy related conditions, first trimester: Secondary | ICD-10-CM

## 2019-10-18 DIAGNOSIS — O210 Mild hyperemesis gravidarum: Secondary | ICD-10-CM | POA: Diagnosis not present

## 2019-10-18 DIAGNOSIS — Z3A01 Less than 8 weeks gestation of pregnancy: Secondary | ICD-10-CM | POA: Insufficient documentation

## 2019-10-18 DIAGNOSIS — O219 Vomiting of pregnancy, unspecified: Secondary | ICD-10-CM | POA: Diagnosis not present

## 2019-10-18 HISTORY — DX: Other specified health status: Z78.9

## 2019-10-18 LAB — URINALYSIS, ROUTINE W REFLEX MICROSCOPIC
Bilirubin Urine: NEGATIVE
Glucose, UA: NEGATIVE mg/dL
Hgb urine dipstick: NEGATIVE
Ketones, ur: 80 mg/dL — AB
Nitrite: NEGATIVE
Protein, ur: 100 mg/dL — AB
Specific Gravity, Urine: 1.034 — ABNORMAL HIGH (ref 1.005–1.030)
pH: 5 (ref 5.0–8.0)

## 2019-10-18 MED ORDER — FAMOTIDINE IN NACL 20-0.9 MG/50ML-% IV SOLN
20.0000 mg | Freq: Once | INTRAVENOUS | Status: AC
  Administered 2019-10-18: 20:00:00 20 mg via INTRAVENOUS
  Filled 2019-10-18: qty 50

## 2019-10-18 MED ORDER — PROMETHAZINE HCL 25 MG/ML IJ SOLN
25.0000 mg | Freq: Once | INTRAMUSCULAR | Status: AC
  Administered 2019-10-18: 20:00:00 25 mg via INTRAVENOUS
  Filled 2019-10-18: qty 1

## 2019-10-18 MED ORDER — PROMETHAZINE HCL 12.5 MG PO TABS: 12.5000 mg | ORAL_TABLET | Freq: Four times a day (QID) | ORAL | 0 refills | Status: DC | PRN

## 2019-10-18 MED ORDER — LACTATED RINGERS IV BOLUS
1000.0000 mL | Freq: Once | INTRAVENOUS | Status: AC
  Administered 2019-10-18: 1000 mL via INTRAVENOUS

## 2019-10-18 MED ORDER — OMEPRAZOLE 20 MG PO CPDR: 20.0000 mg | DELAYED_RELEASE_CAPSULE | Freq: Every day | ORAL | 1 refills | Status: DC

## 2019-10-18 MED ORDER — ONDANSETRON 4 MG PO TBDP: 4.0000 mg | ORAL_TABLET | Freq: Three times a day (TID) | ORAL | 0 refills | Status: DC | PRN

## 2019-10-18 NOTE — MAU Provider Note (Addendum)
Chief Complaint: Nausea and Emesis   First Provider Initiated Contact with Patient 10/18/19 1903     SUBJECTIVE HPI: KEYMANI GLYNN is a 34 y.o. [id number] at [redacted]w[redacted]d who presents to Maternity Admissions reporting nausea & vomiting. Vomits 6 times per day. Was previously prescribed diclegis & zofran. Has only been taking zofran because the pharmacy didn't fill the diclegis. Last took zofran last night. States the zofran works but once the 8 hours are up she feels bad again. Has been able to keep down water but reports she hasn't tolerate food since Saturday. Denies abdominal pain, vaginal bleeding, diarrhea, or constipation. Has not started prenatal care yet.    Past Medical History:  Diagnosis Date  . Medical history non-contributory    OB History  Gravida Para Term Preterm AB Living  5 4 3 1   4   SAB TAB Ectopic Multiple Live Births  0 0     4    # Outcome Date GA Lbr Len/2nd Weight Sex Delivery Anes PTL Lv  5 Current           4 Preterm           3 Term           2 Term           1 Term            Past Surgical History:  Procedure Laterality Date  . NO PAST SURGERIES     Social History   Socioeconomic History  . Marital status: Single    Spouse name: Not on file  . Number of children: Not on file  . Years of education: Not on file  . Highest education level: Not on file  Occupational History  . Not on file  Tobacco Use  . Smoking status: Never Smoker  . Smokeless tobacco: Never Used  Substance and Sexual Activity  . Alcohol use: Yes  . Drug use: Never  . Sexual activity: Yes    Birth control/protection: None  Other Topics Concern  . Not on file  Social History Narrative  . Not on file   Social Determinants of Health   Financial Resource Strain:   . Difficulty of Paying Living Expenses: Not on file  Food Insecurity:   . Worried About Charity fundraiser in the Last Year: Not on file  . Ran Out of Food in the Last Year: Not on file  Transportation Needs:   .  Lack of Transportation (Medical): Not on file  . Lack of Transportation (Non-Medical): Not on file  Physical Activity:   . Days of Exercise per Week: Not on file  . Minutes of Exercise per Session: Not on file  Stress:   . Feeling of Stress : Not on file  Social Connections:   . Frequency of Communication with Friends and Family: Not on file  . Frequency of Social Gatherings with Friends and Family: Not on file  . Attends Religious Services: Not on file  . Active Member of Clubs or Organizations: Not on file  . Attends Archivist Meetings: Not on file  . Marital Status: Not on file  Intimate Partner Violence:   . Fear of Current or Ex-Partner: Not on file  . Emotionally Abused: Not on file  . Physically Abused: Not on file  . Sexually Abused: Not on file   Family History  Problem Relation Age of Onset  . Hypertension Mother    No current facility-administered medications on  file prior to encounter.   Current Outpatient Medications on File Prior to Encounter  Medication Sig Dispense Refill  . Prenatal MV & Min w/FA-DHA (PRENATAL ADULT GUMMY/DHA/FA) 0.4-25 MG CHEW Chew 1 each by mouth daily. 60 tablet 0  . Doxylamine-Pyridoxine 10-10 MG TBEC Take 2 tablets by mouth at bedtime. 60 tablet 0   No Known Allergies  I have reviewed patient's Past Medical Hx, Surgical Hx, Family Hx, Social Hx, medications and allergies.   Review of Systems  Constitutional: Negative.   Gastrointestinal: Positive for nausea and vomiting. Negative for abdominal pain, constipation and diarrhea.  Genitourinary: Negative.     OBJECTIVE Patient Vitals for the past 24 hrs:  BP Temp Temp src Pulse Resp SpO2 Height Weight  10/18/19 1903 126/74 - - 98 - - - -  10/18/19 1832 116/65 98.7 F (37.1 C) Oral (!) 105 20 100 % - -  10/18/19 1827 - - - - - - 5\' 1"  (1.549 m) 81.2 kg   Constitutional: Well-developed, well-nourished female in no acute distress.  Cardiovascular: normal rate & rhythm, no  murmur Respiratory: normal rate and effort. Lung sounds clear throughout GI: Abd soft, non-tender, Pos BS x 4. No guarding or rebound tenderness MS: Extremities nontender, no edema, normal ROM Neurologic: Alert and oriented x 4.      LAB RESULTS Results for orders placed or performed during the hospital encounter of 10/18/19 (from the past 24 hour(s))  Urinalysis, Routine w reflex microscopic     Status: Abnormal   Collection Time: 10/18/19  6:35 PM  Result Value Ref Range   Color, Urine AMBER (A) YELLOW   APPearance CLOUDY (A) CLEAR   Specific Gravity, Urine 1.034 (H) 1.005 - 1.030   pH 5.0 5.0 - 8.0   Glucose, UA NEGATIVE NEGATIVE mg/dL   Hgb urine dipstick NEGATIVE NEGATIVE   Bilirubin Urine NEGATIVE NEGATIVE   Ketones, ur 80 (A) NEGATIVE mg/dL   Protein, ur 12/16/19 (A) NEGATIVE mg/dL   Nitrite NEGATIVE NEGATIVE   Leukocytes,Ua TRACE (A) NEGATIVE   RBC / HPF 0-5 0 - 5 RBC/hpf   WBC, UA 0-5 0 - 5 WBC/hpf   Bacteria, UA MANY (A) NONE SEEN   Squamous Epithelial / LPF 11-20 0 - 5   Mucus PRESENT     IMAGING No results found.  MAU COURSE Orders Placed This Encounter  Procedures  . Urinalysis, Routine w reflex microscopic  . Discharge patient   Meds ordered this encounter  Medications  . lactated ringers bolus 1,000 mL  . famotidine (PEPCID) IVPB 20 mg premix  . promethazine (PHENERGAN) injection 25 mg  . ondansetron (ZOFRAN ODT) 4 MG disintegrating tablet    Sig: Take 1 tablet (4 mg total) by mouth every 8 (eight) hours as needed for nausea or vomiting.    Dispense:  20 tablet    Refill:  0  . promethazine (PHENERGAN) 12.5 MG tablet    Sig: Take 1 tablet (12.5 mg total) by mouth every 6 (six) hours as needed for nausea or vomiting.    Dispense:  30 tablet    Refill:  0  . omeprazole (PRILOSEC) 20 MG capsule    Sig: Take 1 capsule (20 mg total) by mouth daily.    Dispense:  30 capsule    Refill:  1    MDM No ob complaints today U/a with 80 of ketones & increased  specific gravity. Will give IV fluid bolus, phenergan, & pepcid.   Care turned over to Dr.  Romona Curls, NP 10/18/2019  7:49 PM  Assumed care of patient at 1949. Presented to MAU with complaints of nausea/vomiting and heartburn in early pregnancy. IVF, Phenergan and Pepcid given. Went bedside to assess patient at 2035. Patient reports feeling much better and comfortable going home. No nausea/vomiting currently. Tolerate PO challenge of crackers. Discussed plan of care. Zofran refilled and Phenergan/Prilosec prescribed on discharge. Advised to f/u with [facility] provider.   9:00 PM Jerilynn Birkenhead, MD [facility] Family Medicine Fellow, [facility] for [facility], [facility]

## 2019-10-18 NOTE — MAU Note (Signed)
Presents with c/o N&V, states hasn't been able to eat anything.  Reports able to keep fluids down.

## 2019-10-29 ENCOUNTER — Encounter (HOSPITAL_COMMUNITY): Payer: Self-pay | Admitting: Obstetrics and Gynecology

## 2019-10-29 ENCOUNTER — Other Ambulatory Visit: Payer: Self-pay

## 2019-10-29 ENCOUNTER — Inpatient Hospital Stay (HOSPITAL_COMMUNITY)
Admission: AD | Admit: 2019-10-29 | Discharge: 2019-10-29 | Disposition: A | Discharge status: Home / Self Care | Payer: Medicaid Other | Attending: Obstetrics and Gynecology | Admitting: Obstetrics and Gynecology

## 2019-10-29 DIAGNOSIS — Z3A08 8 weeks gestation of pregnancy: Secondary | ICD-10-CM | POA: Insufficient documentation

## 2019-10-29 DIAGNOSIS — O219 Vomiting of pregnancy, unspecified: Secondary | ICD-10-CM | POA: Diagnosis not present

## 2019-10-29 DIAGNOSIS — Z79899 Other long term (current) drug therapy: Secondary | ICD-10-CM | POA: Insufficient documentation

## 2019-10-29 LAB — URINALYSIS, ROUTINE W REFLEX MICROSCOPIC
Glucose, UA: NEGATIVE mg/dL
Hgb urine dipstick: NEGATIVE
Ketones, ur: 80 mg/dL — AB
Nitrite: NEGATIVE
Protein, ur: 30 mg/dL — AB
Specific Gravity, Urine: 1.03 (ref 1.005–1.030)
Squamous Epithelial / HPF: >50 — ABNORMAL HIGH (ref 0–5)
pH: 5 (ref 5.0–8.0)

## 2019-10-29 MED ORDER — FAMOTIDINE IN NACL 20-0.9 MG/50ML-% IV SOLN
20.0000 mg | Freq: Once | INTRAVENOUS | Status: AC
  Administered 2019-10-29: 12:00:00 20 mg via INTRAVENOUS
  Filled 2019-10-29: qty 50

## 2019-10-29 MED ORDER — LACTATED RINGERS IV BOLUS
1000.0000 mL | Freq: Once | INTRAVENOUS | Status: AC
  Administered 2019-10-29: 12:00:00 1000 mL via INTRAVENOUS

## 2019-10-29 MED ORDER — SODIUM CHLORIDE 0.9 % IV SOLN
25.0000 mg | INTRAVENOUS | Status: AC
  Administered 2019-10-29: 13:00:00 25 mg via INTRAVENOUS
  Filled 2019-10-29: qty 1

## 2019-10-29 NOTE — Discharge Instructions (Signed)
Nausea medication to take during pregnancy:  Unisom (doxylamine succinate 25 mg tablets) Take one tablet daily at bedtime. If symptoms are not adequately controlled, the dose can be increased to a maximum recommended dose of two tablets daily (1/2 tablet in the morning, 1/2 tablet mid-afternoon and one at bedtime). Vitamin B6 100mg  tablets. Take one tablet twice a day (up to 200 mg per day).       Nausea and Vomiting, Adult Nausea is the feeling that you have an upset stomach or that you are about to vomit. Vomiting is when stomach contents are thrown up and out of the mouth as a result of nausea. Vomiting can make you feel weak and cause you to become dehydrated. Dehydration can make you feel tired and thirsty, cause you to have a dry mouth, and decrease how often you urinate. Older adults and people with other diseases or a weak disease-fighting system (immune system) are at higher risk for dehydration. It is important to treat your nausea and vomiting as told by your health care provider. Follow these instructions at home: Watch your symptoms for any changes. Tell your health care provider about them. Follow these instructions to care for yourself at home. Eating and drinking      Take an oral rehydration solution (ORS). This is a drink that is sold at pharmacies and retail stores.  Drink clear fluids slowly and in small amounts as you are able. Clear fluids include water, ice chips, low-calorie sports drinks, and fruit juice that has water added (diluted fruit juice).  Eat bland, easy-to-digest foods in small amounts as you are able. These foods include bananas, applesauce, rice, lean meats, toast, and crackers.  Avoid fluids that contain a lot of sugar or caffeine, such as energy drinks, sports drinks, and soda.  Avoid alcohol.  Avoid spicy or fatty foods. General instructions  Take over-the-counter and prescription medicines only as told by your health care provider.  Drink  enough fluid to keep your urine pale yellow.  Wash your hands often using soap and water. If soap and water are not available, use hand sanitizer.  Make sure that all people in your household wash their hands well and often.  Rest at home while you recover.  Watch your condition for any changes.  Breathe slowly and deeply when you feel nauseated.  Keep all follow-up visits as told by your health care provider. This is important. Contact a health care provider if:  Your symptoms get worse.  You have new symptoms.  You have a fever.  You cannot drink fluids without vomiting.  Your nausea does not go away after 2 days.  You feel light-headed or dizzy.  You have a headache.  You have muscle cramps.  You have a rash.  You have pain while urinating. Get help right away if:  You have pain in your chest, neck, arm, or jaw.  You feel extremely weak or you faint.  You have persistent vomiting.  You have vomit that is bright red or looks like black coffee grounds.  You have bloody or black stools or stools that look like tar.  You have a severe headache, a stiff neck, or both.  You have severe pain, cramping, or bloating in your abdomen.  You have difficulty breathing, or you are breathing very quickly.  Your heart is beating very quickly.  Your skin feels cold and clammy.  You feel confused.  You have signs of dehydration, such as: ? Dark urine, very  little urine, or no urine. ? Cracked lips. ? Dry mouth. ? Sunken eyes. ? Sleepiness. ? Weakness. These symptoms may represent a serious problem that is an emergency. Do not wait to see if the symptoms will go away. Get medical help right away. Call your local emergency services (911 in the [place] Do not drive yourself to the hospital. Summary  Nausea is the feeling that you have an upset stomach or that you are about to vomit. As nausea gets worse, it can lead to vomiting. Vomiting can make you feel weak and  cause you to become dehydrated.  Follow instructions from your health care provider about eating and drinking to prevent dehydration.  Take over-the-counter and prescription medicines only as told by your health care provider.  Contact your health care provider if your symptoms get worse, or you have new symptoms.  Keep all follow-up visits as told by your health care provider. This is important. This information is not intended to replace advice given to you by your health care provider. Make sure you discuss any questions you have with your health care provider. Document Revised: 12/24/2018 Document Reviewed: 02/09/2018 Elsevier Patient Education  [employer].

## 2019-10-29 NOTE — MAU Note (Signed)
Ongoing vomiting.  Says was given meds for it, but they aren't helping.

## 2019-10-29 NOTE — MAU Provider Note (Signed)
Chief Complaint: Emesis   First Provider Initiated Contact with Patient 10/29/19 1058     SUBJECTIVE HPI: Kayla Brandt is a 34 y.o. [id number] at [redacted]w[redacted]d who presents to Maternity Admissions reporting continued nausea & vomiting. Was seen in MAU on 2/2 for the same. States she continues to vomit 2-3 times per day and has trouble keeping down solid foods. Is taking phenergan, zofran, & prilosec at home. Took phenergan this morning around 4 am. Denies abdominal pain, fever, diarrhea, or vaginal bleeding. Plans on going to [facility] for prenatal care but has never been seen there - has appointment in March.    Past Medical History:  Diagnosis Date  . Medical history non-contributory    OB History  Gravida Para Term Preterm AB Living  5 4 3 1   4   SAB TAB Ectopic Multiple Live Births  0 0     4    # Outcome Date GA Lbr Len/2nd Weight Sex Delivery Anes PTL Lv  5 Current           4 Preterm           3 Term           2 Term           1 Term            Past Surgical History:  Procedure Laterality Date  . NO PAST SURGERIES     Social History   Socioeconomic History  . Marital status: Single    Spouse name: Not on file  . Number of children: Not on file  . Years of education: Not on file  . Highest education level: Not on file  Occupational History  . Not on file  Tobacco Use  . Smoking status: Never Smoker  . Smokeless tobacco: Never Used  Substance and Sexual Activity  . Alcohol use: Not Currently  . Drug use: Never  . Sexual activity: Yes    Birth control/protection: None  Other Topics Concern  . Not on file  Social History Narrative  . Not on file   Social Determinants of Health   Financial Resource Strain:   . Difficulty of Paying Living Expenses: Not on file  Food Insecurity:   . Worried About Charity fundraiser in the Last Year: Not on file  . Ran Out of Food in the Last Year: Not on file  Transportation Needs:   . Lack of Transportation (Medical): Not on file  .  Lack of Transportation (Non-Medical): Not on file  Physical Activity:   . Days of Exercise per Week: Not on file  . Minutes of Exercise per Session: Not on file  Stress:   . Feeling of Stress : Not on file  Social Connections:   . Frequency of Communication with Friends and Family: Not on file  . Frequency of Social Gatherings with Friends and Family: Not on file  . Attends Religious Services: Not on file  . Active Member of Clubs or Organizations: Not on file  . Attends Archivist Meetings: Not on file  . Marital Status: Not on file  Intimate Partner Violence:   . Fear of Current or Ex-Partner: Not on file  . Emotionally Abused: Not on file  . Physically Abused: Not on file  . Sexually Abused: Not on file   Family History  Problem Relation Age of Onset  . Hypertension Mother    No current facility-administered medications on file prior to encounter.  Current Outpatient Medications on File Prior to Encounter  Medication Sig Dispense Refill  . omeprazole (PRILOSEC) 20 MG capsule Take 1 capsule (20 mg total) by mouth daily. 30 capsule 1  . ondansetron (ZOFRAN ODT) 4 MG disintegrating tablet Take 1 tablet (4 mg total) by mouth every 8 (eight) hours as needed for nausea or vomiting. 20 tablet 0  . promethazine (PHENERGAN) 12.5 MG tablet Take 1 tablet (12.5 mg total) by mouth every 6 (six) hours as needed for nausea or vomiting. 30 tablet 0  . Doxylamine-Pyridoxine 10-10 MG TBEC Take 2 tablets by mouth at bedtime. 60 tablet 0  . Prenatal MV & Min w/FA-DHA (PRENATAL ADULT GUMMY/DHA/FA) 0.4-25 MG CHEW Chew 1 each by mouth daily. 60 tablet 0   No Known Allergies  I have reviewed patient's Past Medical Hx, Surgical Hx, Family Hx, Social Hx, medications and allergies.   Review of Systems  Constitutional: Negative.   Gastrointestinal: Positive for constipation, nausea and vomiting. Negative for abdominal pain and diarrhea.  Genitourinary: Negative.     OBJECTIVE Patient  Vitals for the past 24 hrs:  BP Temp Temp src Pulse Resp SpO2 Weight  10/29/19 1030 123/85 98.1 F (36.7 C) Oral (!) 109 16 100 % --  10/29/19 1022 -- -- -- -- -- -- 79.3 kg   Constitutional: Well-developed, well-nourished female in no acute distress.  Cardiovascular: normal rate & rhythm, no murmur Respiratory: normal rate and effort. Lung sounds clear throughout GI: Abd soft, non-tender, Pos BS x 4. No guarding or rebound tenderness MS: Extremities nontender, no edema, normal ROM Neurologic: Alert and oriented x 4.    LAB RESULTS Results for orders placed or performed during the hospital encounter of 10/29/19 (from the past 24 hour(s))  Urinalysis, Routine w reflex microscopic     Status: Abnormal   Collection Time: 10/29/19 10:54 AM  Result Value Ref Range   Color, Urine AMBER (A) YELLOW   APPearance TURBID (A) CLEAR   Specific Gravity, Urine 1.030 1.005 - 1.030   pH 5.0 5.0 - 8.0   Glucose, UA NEGATIVE NEGATIVE mg/dL   Hgb urine dipstick NEGATIVE NEGATIVE   Bilirubin Urine SMALL (A) NEGATIVE   Ketones, ur 80 (A) NEGATIVE mg/dL   Protein, ur 30 (A) NEGATIVE mg/dL   Nitrite NEGATIVE NEGATIVE   Leukocytes,Ua TRACE (A) NEGATIVE   RBC / HPF 6-10 0 - 5 RBC/hpf   WBC, UA 6-10 0 - 5 WBC/hpf   Bacteria, UA RARE (A) NONE SEEN   Squamous Epithelial / LPF >50 (H) 0 - 5   Mucus PRESENT     IMAGING No results found.  MAU COURSE Orders Placed This Encounter  Procedures  . Urinalysis, Routine w reflex microscopic  . Discharge patient   Meds ordered this encounter  Medications  . lactated ringers bolus 1,000 mL  . famotidine (PEPCID) IVPB 20 mg premix  . promethazine (PHENERGAN) 25 mg in sodium chloride 0.9 % 1,000 mL infusion    MDM No active vomiting while in MAU but U/a consistent with dehydration.  IV fluids, pepcid, and phenergan given Pt has not vomited in MAU & able to tolerate crackers Will d/c home, pt to continue antiemetics on a schedule. Given instructions on  unisom/b6 in addition to other meds.   ASSESSMENT 1. Nausea and vomiting during pregnancy prior to [redacted] weeks gestation     PLAN Discharge home in stable condition. Discussed reasons to return to MAU  Allergies as of 10/29/2019   No Known Allergies  Medication List    TAKE these medications   Doxylamine-Pyridoxine 10-10 MG Tbec Take 2 tablets by mouth at bedtime.   omeprazole 20 MG capsule Commonly known as: PriLOSEC Take 1 capsule (20 mg total) by mouth daily.   ondansetron 4 MG disintegrating tablet Commonly known as: Zofran ODT Take 1 tablet (4 mg total) by mouth every 8 (eight) hours as needed for nausea or vomiting.   Prenatal Adult Gummy/DHA/FA 0.4-25 MG Chew Chew 1 each by mouth daily.   promethazine 12.5 MG tablet Commonly known as: PHENERGAN Take 1 tablet (12.5 mg total) by mouth every 6 (six) hours as needed for nausea or vomiting.        Judeth Horn, NP 10/29/2019  3:07 PM

## 2019-11-07 ENCOUNTER — Other Ambulatory Visit: Payer: Self-pay

## 2019-11-07 ENCOUNTER — Encounter (HOSPITAL_COMMUNITY): Payer: Self-pay | Admitting: Obstetrics & Gynecology

## 2019-11-07 ENCOUNTER — Inpatient Hospital Stay (HOSPITAL_COMMUNITY): Payer: Medicaid Other

## 2019-11-07 ENCOUNTER — Inpatient Hospital Stay (HOSPITAL_COMMUNITY)
Admission: AD | Admit: 2019-11-07 | Discharge: 2019-11-08 | Disposition: A | Discharge status: Home / Self Care | Payer: Medicaid Other | Attending: Obstetrics & Gynecology | Admitting: Obstetrics & Gynecology

## 2019-11-07 DIAGNOSIS — Z79899 Other long term (current) drug therapy: Secondary | ICD-10-CM | POA: Diagnosis not present

## 2019-11-07 DIAGNOSIS — R109 Unspecified abdominal pain: Secondary | ICD-10-CM | POA: Diagnosis present

## 2019-11-07 DIAGNOSIS — E876 Hypokalemia: Secondary | ICD-10-CM | POA: Insufficient documentation

## 2019-11-07 DIAGNOSIS — O99281 Endocrine, nutritional and metabolic diseases complicating pregnancy, first trimester: Secondary | ICD-10-CM | POA: Diagnosis not present

## 2019-11-07 DIAGNOSIS — R1013 Epigastric pain: Secondary | ICD-10-CM | POA: Diagnosis not present

## 2019-11-07 DIAGNOSIS — O26891 Other specified pregnancy related conditions, first trimester: Secondary | ICD-10-CM | POA: Diagnosis not present

## 2019-11-07 DIAGNOSIS — Z8249 Family history of ischemic heart disease and other diseases of the circulatory system: Secondary | ICD-10-CM | POA: Insufficient documentation

## 2019-11-07 DIAGNOSIS — O3680X0 Pregnancy with inconclusive fetal viability, not applicable or unspecified: Secondary | ICD-10-CM

## 2019-11-07 DIAGNOSIS — O219 Vomiting of pregnancy, unspecified: Secondary | ICD-10-CM

## 2019-11-07 DIAGNOSIS — Z3A10 10 weeks gestation of pregnancy: Secondary | ICD-10-CM | POA: Insufficient documentation

## 2019-11-07 LAB — CBC
HCT: 38.8 % (ref 36.0–46.0)
Hemoglobin: 13.9 g/dL (ref 12.0–15.0)
MCH: 29.5 pg (ref 26.0–34.0)
MCHC: 35.8 g/dL (ref 30.0–36.0)
MCV: 82.4 fL (ref 80.0–100.0)
Platelets: 304 10*3/uL (ref 150–400)
RBC: 4.71 MIL/uL (ref 3.87–5.11)
RDW: 11 % — ABNORMAL LOW (ref 11.5–15.5)
WBC: 8.3 10*3/uL (ref 4.0–10.5)
nRBC: 0 % (ref 0.0–0.2)

## 2019-11-07 LAB — TYPE AND SCREEN
ABO/RH(D): O POS
Antibody Screen: NEGATIVE

## 2019-11-07 LAB — COMPREHENSIVE METABOLIC PANEL
ALT: 17 U/L (ref 0–44)
AST: 17 U/L (ref 15–41)
Albumin: 3.6 g/dL (ref 3.5–5.0)
Alkaline Phosphatase: 44 U/L (ref 38–126)
Anion gap: 14 (ref 5–15)
BUN: 12 mg/dL (ref 6–20)
CO2: 21 mmol/L — ABNORMAL LOW (ref 22–32)
Calcium: 9.9 mg/dL (ref 8.9–10.3)
Chloride: 98 mmol/L (ref 98–111)
Creatinine, Ser: 0.59 mg/dL (ref 0.44–1.00)
GFR calc Af Amer: >60 mL/min (ref >60)
GFR calc non Af Amer: >60 mL/min (ref >60)
Glucose, Bld: 118 mg/dL — ABNORMAL HIGH (ref 70–99)
Potassium: 2.6 mmol/L — CL (ref 3.5–5.1)
Sodium: 133 mmol/L — ABNORMAL LOW (ref 135–145)
Total Bilirubin: 0.7 mg/dL (ref 0.3–1.2)
Total Protein: 7.2 g/dL (ref 6.5–8.1)

## 2019-11-07 LAB — ABO/RH: ABO/RH(D): O POS

## 2019-11-07 LAB — HCG, QUANTITATIVE, PREGNANCY: hCG, Beta Chain, Quant, S: 147713 m[IU]/mL — ABNORMAL HIGH (ref <5)

## 2019-11-07 MED ORDER — POTASSIUM CHLORIDE 10 MEQ/100ML IV SOLN
10.0000 meq | INTRAVENOUS | Status: AC
  Administered 2019-11-07 – 2019-11-08 (×3): 10 meq via INTRAVENOUS
  Filled 2019-11-07 (×3): qty 100

## 2019-11-07 MED ORDER — ONDANSETRON HCL 4 MG/2ML IJ SOLN
4.0000 mg | Freq: Once | INTRAMUSCULAR | Status: AC
  Administered 2019-11-07: 22:00:00 4 mg via INTRAVENOUS
  Filled 2019-11-07: qty 2

## 2019-11-07 MED ORDER — LACTATED RINGERS IV BOLUS
1000.0000 mL | Freq: Once | INTRAVENOUS | Status: AC
  Administered 2019-11-07: 22:00:00 1000 mL via INTRAVENOUS

## 2019-11-07 MED ORDER — M.V.I. ADULT IV INJ
Freq: Once | INTRAVENOUS | Status: AC
  Filled 2019-11-07: qty 10

## 2019-11-07 MED ORDER — GLYCOPYRROLATE 0.2 MG/ML IJ SOLN: 0.2000 mg | Freq: Once | INTRAMUSCULAR | Status: DC

## 2019-11-07 MED ORDER — FAMOTIDINE IN NACL 20-0.9 MG/50ML-% IV SOLN
20.0000 mg | Freq: Once | INTRAVENOUS | Status: AC
  Administered 2019-11-07: 22:00:00 20 mg via INTRAVENOUS
  Filled 2019-11-07: qty 50

## 2019-11-07 NOTE — MAU Note (Signed)
PT PRESENTS  SAYING  CHEST PAIN- BETWEEN BREAST - STARTED AT 8PM- WHILE LAYING IN BED  WATCHING TV.

## 2019-11-08 DIAGNOSIS — E876 Hypokalemia: Secondary | ICD-10-CM

## 2019-11-08 DIAGNOSIS — Z3A09 9 weeks gestation of pregnancy: Secondary | ICD-10-CM

## 2019-11-08 DIAGNOSIS — O26891 Other specified pregnancy related conditions, first trimester: Secondary | ICD-10-CM

## 2019-11-08 DIAGNOSIS — O219 Vomiting of pregnancy, unspecified: Secondary | ICD-10-CM

## 2019-11-08 DIAGNOSIS — R109 Unspecified abdominal pain: Secondary | ICD-10-CM

## 2019-11-08 MED ORDER — FAMOTIDINE 20 MG PO TABS: 20.0000 mg | ORAL_TABLET | Freq: Every day | ORAL | 0 refills | Status: DC

## 2019-11-08 MED ORDER — GLYCOPYRROLATE 1 MG PO TABS: 1.0000 mg | ORAL_TABLET | Freq: Three times a day (TID) | ORAL | 0 refills | Status: DC

## 2019-11-08 NOTE — MAU Provider Note (Signed)
History     CSN: [id number]  Arrival date and time: 11/07/19 2044   First Provider Initiated Contact with Patient 11/07/19 2058      Chief Complaint  Patient presents with  . Abdominal Pain   Kayla Brandt is a 34 y.o. G5P4 at [redacted]w[redacted]d by LMP who presents to MAU with complaints of abdominal pain and chest pain. She reports pain started out of nowhere around 2000 today. She reports chest pain is under her breast, under her sternum, midline (epigastric region). Denies pain over heart, above heart, shoulder or jaw pain. Rates epigastric pain 9/10 - patient reports that it is "so severe it takes my breath away". Has not taken any thing but immediately came to MAU. Patient reports lower abdominal pain, describes as intermittent cramping, rates 4/10. She denies vaginal bleeding, discharge or urinary symptoms. Reports she has been seen in MAU multiple times for N/V and has been unable to eat solid foods for almost 2 weeks. Patient reports she is able to consume liquids without vomiting. Currently taking zofran at home for N/V.    OB History    Gravida  5   Para  4   Term  3   Preterm  1   AB      Living  4     SAB  0   TAB  0   Ectopic      Multiple      Live Births  4           Past Medical History:  Diagnosis Date  . Medical history non-contributory     Past Surgical History:  Procedure Laterality Date  . NO PAST SURGERIES      Family History  Problem Relation Age of Onset  . Hypertension Mother     Social History   Tobacco Use  . Smoking status: Never Smoker  . Smokeless tobacco: Never Used  Substance Use Topics  . Alcohol use: Not Currently  . Drug use: Never    Allergies: No Known Allergies  Medications Prior to Admission  Medication Sig Dispense Refill Last Dose  . Doxylamine-Pyridoxine 10-10 MG TBEC Take 2 tablets by mouth at bedtime. 60 tablet 0 11/07/2019 at Unknown time  . omeprazole (PRILOSEC) 20 MG capsule Take 1 capsule (20 mg total) by  mouth daily. 30 capsule 1 11/07/2019 at Unknown time  . ondansetron (ZOFRAN ODT) 4 MG disintegrating tablet Take 1 tablet (4 mg total) by mouth every 8 (eight) hours as needed for nausea or vomiting. 20 tablet 0 11/07/2019 at Unknown time  . Prenatal MV & Min w/FA-DHA (PRENATAL ADULT GUMMY/DHA/FA) 0.4-25 MG CHEW Chew 1 each by mouth daily. 60 tablet 0 11/07/2019 at Unknown time  . promethazine (PHENERGAN) 12.5 MG tablet Take 1 tablet (12.5 mg total) by mouth every 6 (six) hours as needed for nausea or vomiting. 30 tablet 0 11/06/2019 at Unknown time    Review of Systems  Constitutional: Negative.   Respiratory: Negative.   Cardiovascular: Positive for chest pain.  Gastrointestinal: Positive for abdominal pain, nausea and vomiting. Negative for constipation and diarrhea.  Genitourinary: Negative.   Musculoskeletal: Negative.   Neurological: Negative.    Physical Exam   Blood pressure 121/87, pulse (!) 134, temperature 98.2 F (36.8 C), temperature source Oral, resp. rate 20, last menstrual period 08/30/2019, SpO2 100 %.  Physical Exam  Nursing note and vitals reviewed. Constitutional: She is oriented to person, place, and time. She appears well-developed and well-nourished. She appears distressed.  Patient is tearful and anxious d/t pain  Cardiovascular: Normal rate, regular rhythm and normal heart sounds.  Respiratory: Effort normal and breath sounds normal. No respiratory distress. She has no wheezes.  GI: Soft. There is abdominal tenderness. There is no rebound and no guarding.  Tenderness in epigastric region, tenderness in suprapubic region with palpation   Musculoskeletal:        General: No edema. Normal range of motion.  Neurological: She is alert and oriented to person, place, and time.  Psychiatric: She has a normal mood and affect. Her behavior is normal. Thought content normal.   MAU Course  Procedures  MDM Orders Placed This Encounter  Procedures  . US OB Comp Less 14  Wks  . CBC  . Comprehensive metabolic panel  . Urinalysis, Routine w reflex microscopic  . hCG, quantitative, pregnancy  . Type and screen [facility]  . ABO/Rh  . Insert peripheral IV   Meds ordered this encounter  Medications  . lactated ringers bolus 1,000 mL  . famotidine (PEPCID) IVPB 20 mg premix  . multivitamins adult (INFUVITE ADULT) 10 mL in lactated ringers 1,000 mL infusion  . ondansetron (ZOFRAN) injection 4 mg  . glycopyrrolate (ROBINUL) injection 0.2 mg  . potassium chloride 10 mEq in 100 mL IVPB  . glycopyrrolate (ROBINUL) 1 MG tablet    Sig: Take 1 tablet (1 mg total) by mouth 3 (three) times daily.    Dispense:  90 tablet    Refill:  0    Order Specific Question:   Supervising Provider    Answer:   Despina Hidden, LUTHER H [[id number]]  . famotidine (PEPCID) 20 MG tablet    Sig: Take 1 tablet (20 mg total) by mouth daily.    Dispense:  30 tablet    Refill:  0    Order Specific Question:   Supervising Provider    Answer:   Duane Lope H [[id number]]   Treatments in MAU included IV LR bolus, zofran, pepcid and MVI. With pepcid and LR bolus given first. Patient reports after initial treatment with pepcid and LR, epigastric pain down to 4/10.   Labs and [place] results pending during N/V treatment  - Labs and [place] report reviewed  Results for orders placed or performed during the hospital encounter of 11/07/19 (from the past 24 hour(s))  Type and screen [facility]     Status: None   Collection Time: 11/07/19  9:06 PM  Result Value Ref Range   ABO/RH(D) O POS    Antibody Screen NEG    Sample Expiration      11/10/2019,2359 Performed at [facility], [address] [place], [place] [address]   ABO/Rh     Status: None   Collection Time: 11/07/19  9:06 PM  Result Value Ref Range   ABO/RH(D)      O POS Performed at [facility], [address] [place], [place] [address]   CBC     Status: Abnormal   Collection Time: 11/07/19  9:09 PM   Result Value Ref Range   WBC 8.3 4.0 - 10.5 K/uL   RBC 4.71 3.87 - 5.11 MIL/uL   Hemoglobin 13.9 12.0 - 15.0 g/dL   HCT 26.8 34.1 - 96.2 %   MCV 82.4 80.0 - 100.0 fL   MCH 29.5 26.0 - 34.0 pg   MCHC 35.8 30.0 - 36.0 g/dL   RDW 22.9 (L) 79.8 - 92.1 %   Platelets 304  150 - 400 K/uL   nRBC 0.0 0.0 - 0.2 %  Comprehensive metabolic panel     Status: Abnormal   Collection Time: 11/07/19  9:09 PM  Result Value Ref Range   Sodium 133 (L) 135 - 145 mmol/L   Potassium 2.6 (LL) 3.5 - 5.1 mmol/L   Chloride 98 98 - 111 mmol/L   CO2 21 (L) 22 - 32 mmol/L   Glucose, Bld 118 (H) 70 - 99 mg/dL   BUN 12 6 - 20 mg/dL   Creatinine, Ser 0.59 0.44 - 1.00 mg/dL   Calcium 9.9 8.9 - 10.3 mg/dL   Total Protein 7.2 6.5 - 8.1 g/dL   Albumin 3.6 3.5 - 5.0 g/dL   AST 17 15 - 41 U/L   ALT 17 0 - 44 U/L   Alkaline Phosphatase 44 38 - 126 U/L   Total Bilirubin 0.7 0.3 - 1.2 mg/dL   GFR calc non Af Amer >60 >60 mL/min   GFR calc Af Amer >60 >60 mL/min   Anion gap 14 5 - 15  hCG, quantitative, pregnancy     Status: Abnormal   Collection Time: 11/07/19  9:09 PM  Result Value Ref Range   hCG, Beta Chain, Quant, S 147,713 (H) <5 mIU/mL   US OB Comp Less 14 Wks  Result Date: 11/07/2019 CLINICAL DATA:  Pelvic pain for 2 days, positive urine pregnancy test, gravida 5 para 4 abortion 0 EXAM: OBSTETRIC <14 WK ULTRASOUND TECHNIQUE: Transabdominal ultrasound was performed for evaluation of the gestation as well as the maternal uterus and adnexal regions. COMPARISON:  None. FINDINGS: Intrauterine gestational sac: Single Yolk sac:  Visualized. Embryo:  Visualized. Cardiac Activity: Visualized. Heart Rate: 177 bpm CRL:   31 mm   10 w 0 d                  [place] EDC: 06/04/2020 Subchorionic hemorrhage:  None visualized. Maternal uterus/adnexae: Uterus is unremarkable. Left ovary measures 3.2 x 2.3 x 2.5 cm in the right ovary measures 3.0 x 1.5 x 1.5 cm. No free fluid or pelvic mass. IMPRESSION: 1. Single live intrauterine  pregnancy as above, estimated age 65 weeks and 0 days. Electronically Signed   By: Randa Ngo M.D.   On: 11/07/2019 22:31   Discussed results of [place] with patient. Discussed results of labs and critical lab value. Given critical value and patient with N/V, offered IV treatment for K+, patient agrees to treatment in MAU   Potassium chloride infusions x3 ordered to be completed in MAU prior to discharge.   Reassessment after treatment completed- patient reports abdominal and epigastric pain is resolved. PO challenge tolerated. Discussed with patient changes in medication with addition of robinul for patient ptyalism and pepcid for GERD.   Discussed reasons to return to MAU. Follow up as scheduled in the office. Return to MAU as needed. Pt stable at time of discharge. Rx sent to pharmacy of choice.  Assessment and Plan   1. Hypokalemia   2. Abdominal pain during pregnancy in first trimester   3. Nausea and vomiting during pregnancy prior to [redacted] weeks gestation    Discharge home Follow up as scheduled in the office for prenatal care- has initial appointment scheduled with [facility] Return to MAU as needed for reasons discussed and/or emergencies  Rx for Robinul and Pepcid sent to pharmacy   Follow-up Information    [facility] [place] Follow up.   Specialty: Obstetrics and Gynecology Why: Follow up as scheduled for  initial prenatal appointment and return to MAU as needed Contact information: [address] [place] [address] [phone]          Allergies as of 11/08/2019   No Known Allergies     Medication List    STOP taking these medications   omeprazole 20 MG capsule Commonly known as: PriLOSEC     TAKE these medications   Doxylamine-Pyridoxine 10-10 MG Tbec Take 2 tablets by mouth at bedtime.   famotidine 20 MG tablet Commonly known as: PEPCID Take 1 tablet (20 mg total) by mouth daily.   glycopyrrolate 1 MG tablet Commonly known as:  Robinul Take 1 tablet (1 mg total) by mouth 3 (three) times daily.   ondansetron 4 MG disintegrating tablet Commonly known as: Zofran ODT Take 1 tablet (4 mg total) by mouth every 8 (eight) hours as needed for nausea or vomiting.   Prenatal Adult Gummy/DHA/FA 0.4-25 MG Chew Chew 1 each by mouth daily.   promethazine 12.5 MG tablet Commonly known as: PHENERGAN Take 1 tablet (12.5 mg total) by mouth every 6 (six) hours as needed for nausea or vomiting.       Sharyon Cable CNM 11/08/2019, 2:09 AM

## 2019-11-22 ENCOUNTER — Encounter: Payer: Self-pay | Admitting: Obstetrics & Gynecology

## 2019-11-29 LAB — OB RESULTS CONSOLE RPR: RPR: NONREACTIVE

## 2019-11-29 LAB — OB RESULTS CONSOLE ABO/RH: RH Type: POSITIVE

## 2019-11-29 LAB — OB RESULTS CONSOLE HEPATITIS B SURFACE ANTIGEN: Hepatitis B Surface Ag: NEGATIVE

## 2019-11-29 LAB — OB RESULTS CONSOLE HIV ANTIBODY (ROUTINE TESTING): HIV: NONREACTIVE

## 2019-11-29 LAB — OB RESULTS CONSOLE RUBELLA ANTIBODY, IGM: Rubella: IMMUNE

## 2019-11-29 LAB — OB RESULTS CONSOLE GC/CHLAMYDIA
Chlamydia: NEGATIVE
Gonorrhea: NEGATIVE

## 2019-11-30 ENCOUNTER — Other Ambulatory Visit: Payer: Self-pay | Admitting: Certified Nurse Midwife

## 2019-12-20 ENCOUNTER — Other Ambulatory Visit (HOSPITAL_COMMUNITY): Payer: Self-pay | Admitting: Obstetrics and Gynecology

## 2019-12-20 DIAGNOSIS — Z3A20 20 weeks gestation of pregnancy: Secondary | ICD-10-CM

## 2019-12-20 DIAGNOSIS — Z3689 Encounter for other specified antenatal screening: Secondary | ICD-10-CM

## 2020-01-17 ENCOUNTER — Other Ambulatory Visit: Payer: Self-pay | Admitting: *Deleted

## 2020-01-17 ENCOUNTER — Other Ambulatory Visit: Payer: Self-pay

## 2020-01-17 ENCOUNTER — Other Ambulatory Visit (HOSPITAL_COMMUNITY): Payer: Self-pay | Admitting: Obstetrics and Gynecology

## 2020-01-17 ENCOUNTER — Ambulatory Visit (HOSPITAL_COMMUNITY)
Admission: RE | Admit: 2020-01-17 | Discharge: 2020-01-17 | Disposition: A | Discharge status: Home / Self Care | Payer: Medicaid Other | Source: Ambulatory Visit | Attending: Obstetrics and Gynecology | Admitting: Obstetrics and Gynecology

## 2020-01-17 DIAGNOSIS — Z363 Encounter for antenatal screening for malformations: Secondary | ICD-10-CM | POA: Insufficient documentation

## 2020-01-17 DIAGNOSIS — O99212 Obesity complicating pregnancy, second trimester: Secondary | ICD-10-CM

## 2020-01-17 DIAGNOSIS — Z3689 Encounter for other specified antenatal screening: Secondary | ICD-10-CM | POA: Insufficient documentation

## 2020-01-17 DIAGNOSIS — E669 Obesity, unspecified: Secondary | ICD-10-CM | POA: Diagnosis not present

## 2020-01-17 DIAGNOSIS — Z3A20 20 weeks gestation of pregnancy: Secondary | ICD-10-CM

## 2020-01-17 DIAGNOSIS — O99210 Obesity complicating pregnancy, unspecified trimester: Secondary | ICD-10-CM

## 2020-01-17 DIAGNOSIS — O09212 Supervision of pregnancy with history of pre-term labor, second trimester: Secondary | ICD-10-CM | POA: Insufficient documentation

## 2020-02-14 ENCOUNTER — Encounter (HOSPITAL_COMMUNITY): Payer: Self-pay | Admitting: Obstetrics & Gynecology

## 2020-02-14 ENCOUNTER — Inpatient Hospital Stay (HOSPITAL_COMMUNITY)
Admission: AD | Admit: 2020-02-14 | Discharge: 2020-02-14 | Disposition: A | Discharge status: Home / Self Care | Payer: Medicaid Other | Attending: Obstetrics & Gynecology | Admitting: Obstetrics & Gynecology

## 2020-02-14 ENCOUNTER — Other Ambulatory Visit: Payer: Self-pay

## 2020-02-14 ENCOUNTER — Ambulatory Visit: Payer: Medicaid Other | Attending: Obstetrics and Gynecology

## 2020-02-14 DIAGNOSIS — O26892 Other specified pregnancy related conditions, second trimester: Secondary | ICD-10-CM

## 2020-02-14 DIAGNOSIS — Z20822 Contact with and (suspected) exposure to covid-19: Secondary | ICD-10-CM | POA: Insufficient documentation

## 2020-02-14 DIAGNOSIS — Z3689 Encounter for other specified antenatal screening: Secondary | ICD-10-CM | POA: Insufficient documentation

## 2020-02-14 DIAGNOSIS — O99322 Drug use complicating pregnancy, second trimester: Secondary | ICD-10-CM | POA: Diagnosis not present

## 2020-02-14 DIAGNOSIS — O219 Vomiting of pregnancy, unspecified: Secondary | ICD-10-CM | POA: Diagnosis not present

## 2020-02-14 DIAGNOSIS — R102 Pelvic and perineal pain: Secondary | ICD-10-CM | POA: Diagnosis not present

## 2020-02-14 DIAGNOSIS — Z3A24 24 weeks gestation of pregnancy: Secondary | ICD-10-CM

## 2020-02-14 DIAGNOSIS — Z79899 Other long term (current) drug therapy: Secondary | ICD-10-CM | POA: Diagnosis not present

## 2020-02-14 DIAGNOSIS — R519 Headache, unspecified: Secondary | ICD-10-CM | POA: Insufficient documentation

## 2020-02-14 DIAGNOSIS — F1290 Cannabis use, unspecified, uncomplicated: Secondary | ICD-10-CM

## 2020-02-14 DIAGNOSIS — O26899 Other specified pregnancy related conditions, unspecified trimester: Secondary | ICD-10-CM

## 2020-02-14 LAB — CBC WITH DIFFERENTIAL/PLATELET
Abs Immature Granulocytes: 0.07 10*3/uL (ref 0.00–0.07)
Basophils Absolute: 0 10*3/uL (ref 0.0–0.1)
Basophils Relative: 0 %
Eosinophils Absolute: 0.2 10*3/uL (ref 0.0–0.5)
Eosinophils Relative: 2 %
HCT: 36.2 % (ref 36.0–46.0)
Hemoglobin: 11.8 g/dL — ABNORMAL LOW (ref 12.0–15.0)
Immature Granulocytes: 1 %
Lymphocytes Relative: 27 %
Lymphs Abs: 2.8 10*3/uL (ref 0.7–4.0)
MCH: 29.7 pg (ref 26.0–34.0)
MCHC: 32.6 g/dL (ref 30.0–36.0)
MCV: 91.2 fL (ref 80.0–100.0)
Monocytes Absolute: 0.7 10*3/uL (ref 0.1–1.0)
Monocytes Relative: 7 %
Neutro Abs: 6.3 10*3/uL (ref 1.7–7.7)
Neutrophils Relative %: 63 %
Platelets: 254 10*3/uL (ref 150–400)
RBC: 3.97 MIL/uL (ref 3.87–5.11)
RDW: 13 % (ref 11.5–15.5)
WBC: 10.1 10*3/uL (ref 4.0–10.5)
nRBC: 0 % (ref 0.0–0.2)

## 2020-02-14 LAB — COMPREHENSIVE METABOLIC PANEL
ALT: 11 U/L (ref 0–44)
AST: 15 U/L (ref 15–41)
Albumin: 2.6 g/dL — ABNORMAL LOW (ref 3.5–5.0)
Alkaline Phosphatase: 59 U/L (ref 38–126)
Anion gap: 9 (ref 5–15)
BUN: 14 mg/dL (ref 6–20)
CO2: 23 mmol/L (ref 22–32)
Calcium: 8.6 mg/dL — ABNORMAL LOW (ref 8.9–10.3)
Chloride: 103 mmol/L (ref 98–111)
Creatinine, Ser: 0.5 mg/dL (ref 0.44–1.00)
GFR calc Af Amer: >60 mL/min (ref >60)
GFR calc non Af Amer: >60 mL/min (ref >60)
Glucose, Bld: 90 mg/dL (ref 70–99)
Potassium: 3.6 mmol/L (ref 3.5–5.1)
Sodium: 135 mmol/L (ref 135–145)
Total Bilirubin: 0.2 mg/dL — ABNORMAL LOW (ref 0.3–1.2)
Total Protein: 5.8 g/dL — ABNORMAL LOW (ref 6.5–8.1)

## 2020-02-14 LAB — URINALYSIS, ROUTINE W REFLEX MICROSCOPIC
Bilirubin Urine: NEGATIVE
Glucose, UA: NEGATIVE mg/dL
Hgb urine dipstick: NEGATIVE
Ketones, ur: 20 mg/dL — AB
Leukocytes,Ua: NEGATIVE
Nitrite: NEGATIVE
Protein, ur: 30 mg/dL — AB
Specific Gravity, Urine: 1.031 — ABNORMAL HIGH (ref 1.005–1.030)
pH: 6 (ref 5.0–8.0)

## 2020-02-14 LAB — RAPID URINE DRUG SCREEN, HOSP PERFORMED
Amphetamines: NOT DETECTED
Barbiturates: NOT DETECTED
Benzodiazepines: NOT DETECTED
Cocaine: NOT DETECTED
Opiates: NOT DETECTED
Tetrahydrocannabinol: POSITIVE — AB

## 2020-02-14 LAB — WET PREP, GENITAL
Clue Cells Wet Prep HPF POC: NONE SEEN
Sperm: NONE SEEN
Trich, Wet Prep: NONE SEEN
Yeast Wet Prep HPF POC: NONE SEEN

## 2020-02-14 LAB — SARS CORONAVIRUS 2 BY RT PCR (HOSPITAL ORDER, PERFORMED IN ~~LOC~~ HOSPITAL LAB): SARS Coronavirus 2: NEGATIVE

## 2020-02-14 MED ORDER — COMFORT FIT MATERNITY SUPP SM MISC: 1.0000 [IU] | Freq: Every day | 0 refills | Status: DC | PRN

## 2020-02-14 MED ORDER — SODIUM CHLORIDE 0.9 % IV SOLN
8.0000 mg | Freq: Once | INTRAVENOUS | Status: AC
  Administered 2020-02-14: 8 mg via INTRAVENOUS
  Filled 2020-02-14: qty 4

## 2020-02-14 MED ORDER — LACTATED RINGERS IV BOLUS
1000.0000 mL | Freq: Once | INTRAVENOUS | Status: AC
  Administered 2020-02-14: 1000 mL via INTRAVENOUS

## 2020-02-14 MED ORDER — FAMOTIDINE IN NACL 20-0.9 MG/50ML-% IV SOLN
20.0000 mg | Freq: Once | INTRAVENOUS | Status: AC
  Administered 2020-02-14: 20 mg via INTRAVENOUS
  Filled 2020-02-14: qty 50

## 2020-02-14 NOTE — Discharge Instructions (Signed)
Pelvic pain in pregnancy/Pubic Symphysis Pain ° °Some women develop pelvic pain in pregnancy. This is sometimes called pregnancy-related pelvic girdle pain (PGP) or symphysis pubis dysfunction (SPD). °PGP is a collection of uncomfortable symptoms caused by a stiffness of your pelvic joints or the joints moving unevenly at either the back or front of your pelvis. ° °Symptoms of PGP °PGP is not harmful to your baby, but it can be painful and make it hard to get around.  °Women with PGP may feel pain:  °• over the pubic bone at the front in the center, roughly level with your hips °• across 1 or both sides of your lower back  °• in the area between your vagina and anus (perineum)  °• spreading to your thighs °Some women feel or hear a clicking or grinding in the pelvic area.  °The pain can be worse when you're:  °• walking  °• going up or down stairs °• standing on 1 leg (for example, when you're getting dressed)  °• turning over in bed  °• moving your legs apart (for example, when you get out of a car) °Most women with PGP can have a vaginal birth. ° °Non-urgent advice: Call your midwife or doctor if you have pelvic pain and: °• it's hard for you to move around °• it hurts to get out of a car or turn over in bed °• it's painful going up or down stairs °These can be signs of pregnancy-related pelvic girdle pain. ° °Treatments for PGP °Getting diagnosed as early as possible can help keep pain to a minimum and avoid long-term discomfort.  °You can ask your midwife for a referral to a physiotherapist who specializes in obstetric pelvic joint problems.  °Physiotherapy aims to relieve or ease pain, improve muscle function, and improve your pelvic joint position and stability.  °This may include:  °• manual therapy to make sure the joints of your pelvis, hip and spine move normally  °• exercises to strengthen your pelvic floor, stomach, back and hip muscles  °• exercises in water  °• advice and suggestions, including  positions for labor and birth, looking after your baby and positions for sex  °• pain relief, such as TENS °• equipment, if necessary, such as crutches or pelvic support belts °These problems tend not to get completely better until the baby is born, but treatment from an experienced practitioner can improve the symptoms during pregnancy.  °You can contact the [facility] (www.pelvicpartnership.org.uk) for information and support. ° °Coping with pelvic pain in pregnancy °Your physiotherapist may recommend a pelvic support belt to help ease your pain, or crutches to help you get around.  °It can help to plan your day so you avoid activities that cause you pain. For example, do not go up or down stairs more often than you have to.  °The Pelvic, Obstetric & Gynaecological Physiotherapy (POGP) network also offers this advice:  °• be as active as possible within your pain limits, and avoid activities that make the pain worse  °• rest when you can  °• get help with household chores from your partner, family and friends  °• wear flat, supportive shoes  °• sit down to get dressed - for example, do not stand on 1 leg when putting on jeans  °• keep your knees together when getting in and out of the car - a plastic bag on the seat can help you swivel  °• sleep in a comfortable position - for example, on   your side with a pillow between your legs  °• try different ways of turning over in bed - for example, turning over with your knees together and squeezing your buttocks  °• take the stairs 1 at a time, or go upstairs backwards or on your bottom  °• if you're using crutches, have a small backpack to carry things in  °• if you want to have sex, consider different positions, such as kneeling on all fours  °POGP suggests that you avoid:  °• standing on 1 leg  °• bending and twisting to lift, or carrying a baby on 1 hip  °• crossing your legs  °• sitting on the floor, or sitting twisted  °• sitting or standing for long periods   °• lifting heavy weights, such as shopping bags, wet washing or a toddler  °• vacuuming  °• pushing heavy objects, such as a supermarket trolley  °• carrying anything in only 1 hand (try using a small backpack)  °The physiotherapist should be able to provide advice on coping with the emotional impact of living with chronic pain, such as using relaxation techniques. If your pain is causing you considerable distress, then you should let your doctor or midwife know. You may require additional treatment. °Download the POGP leaflet Pregnancy-related pelvic girdle pain for mothers-to-be and new mothers (https://pogp.csp.org.uk/publications). °You can get more information on managing everyday activities with PGP from the [facility] (www.pelvicpartnership.org.uk) ° °Labor and birth with pelvic pain °Many women with pelvic pain in pregnancy can have a normal vaginal birth.  °Plan ahead and talk about your birth plan with your birth partner and midwife.  °Write in your birth plan that you have PGP, so the people supporting you during labor and birth will be aware of your condition.  °Think about birth positions that are the most comfortable for you, and write them in your birth plan.  °Being in water can take the weight off your joints and allow you to move more easily, so you might want to think about having a water birth. You can discuss this with your midwife.  ° °Your 'pain-free range of movement' °If you have pain when you open your legs, find out your pain-free range of movement.  °To do this, lie on your back or sit on the edge of a chair and open your legs as far as you can without pain. °Your partner or midwife can measure the distance between your knees with a tape measure. This is your pain-free range.  °To protect your joints, try not to open your legs wider than this during labor and birth.  °This is particularly important if you have an epidural for pain relief in labor, as you won't be feeling the pain  that warns you that you're separating your legs too far.  °If you have an epidural, make sure your midwife and birth partner are aware of your pain-free range of movement of your legs. °When pushing in the second stage of labor, you may find it beneficial to lie on one side.  °This prevents your legs from being separated too much. You can stay in this position for the birth of your baby, if you wish. °Sometimes it might be necessary to open your legs wider than your pain-free range to deliver your baby safely, particularly if you have an assisted delivery (for example, with the vacuum or foreps). °Even in this case, it's possible to limit the separation of your legs. Make sure your midwife and doctor   are aware that you have PGP.  If you go beyond your pain-free range, your physiotherapist should assess you after the birth.  Take extra care until they have assessed and advised you.  Who gets pelvic pain in pregnancy? It's estimated that PGP affects up to 1 in 5 pregnant women to some degree.  It's not known exactly why pelvic pain affects some women, but it's thought to be linked to a number of issues, including previous damage to the pelvis, pelvic joints moving unevenly, and the weight or position of the baby.  Factors that may make a woman more likely to develop PGP include:  . a history of lower back or pelvic girdle pain  . previous injury to the pelvis (for example, from a fall or accident) . having PGP in a previous pregnancy  . a physically demanding job . being overweight . having a multiple birth pregnancy  [email or web address].org has interviews with women talking about their experiences of pelvic pain in pregnancy. Read more about coping with common pregnancy problems (www.nhs.uk/conditions/pregnancy-and-baby) including nausea, heartburn, tiredness and constipation.  Talk with your doctor or midwife for more information.           Marijuana Use During Pregnancy and  Breastfeeding  Marijuana is the dried leaves, flowers, and stems of the Cannabis sativa or Cannabis indica plant. The plant's active ingredients (cannabinoids), including a chemical called THC, change the chemistry of the brain. Marijuana smoke also has many of the same chemicals as cigarette smoke that cause breathing problems. Marijuana gets into your blood through your lungs when you smoke it and through your digestive system when you swallow it. Using marijuana in any form may be harmful for you and your baby when you are trying to become pregnant and during pregnancy. This includes marijuana that is prescribed to you by a health care provider (medical marijuana). Once marijuana is in your blood, it can travel through your placenta to your baby. It may also pass through breast milk. How does this affect me? Marijuana affects you both mentally and physically. Using marijuana can make you feel high and relaxed. It can also have negative effects, especially at high doses or with long-term use. These include:  Rapid heartbeat and stress on your heart.  Lung irritation and breathing problems.  Difficulty thinking and making decisions.  Seeing or believing things that are not true (hallucinations and paranoia).  Mood swings, depression, or anxiety.  Decreased ability to learn and remember.  Difficulty getting pregnant. Marijuana can also affect your pregnancy. Not all the effects are known. However, if you use marijuana during pregnancy, you may:  Be less likely to get regular prenatal care and do the things that you need to do to have a healthy pregnancy.  Be more likely to use other drugs that can harm your pregnancy, like drinking alcohol and smoking cigarettes.  Be at higher risk of having your baby die after 28 weeks of pregnancy (stillbirth).  Be at higher risk of giving birth before 37 weeks of pregnancy (premature birth). How does this affect my baby? If you use marijuana during  pregnancy, this may affect your baby's development, birth, and life after birth. Your baby may:  Be born prematurely, which can cause physical and mental problems.  Be born with a low birth weight, which can lead to physical and mental problems.  Have problems with brain development.  Have difficulty growing.  Have attention and behavior problems later in life.  Do poorly at school  and have learning problems later in life.  Have problems with vision and coordination.  Be at higher risk for using marijuana by age 3. More research is needed to find out exactly how marijuana affects a baby during breastfeeding. Some studies suggest that the chemicals in marijuana can be passed to a baby through breast milk. To limit possible risks, you should not use marijuana during breastfeeding. Follow these instructions at home:  Let your health care provider know if you use marijuana before trying to get pregnant, during pregnancy, or during breastfeeding.  Do not use marijuana in any form when you are trying to get pregnant, when you are pregnant, or when you are breastfeeding. If you are having trouble stopping marijuana use, ask your health care provider for help.  Do not smoke. If you need help quitting, ask your health care provider for help.  If you are using medical marijuana, ask your health care provider to switch you to a medicine that is safer to use during pregnancy or breastfeeding.  Keep all your prenatal visits as told by your health care provider. This is important. Where to find more information [employer] on Drug Abuse: www.drugabuse.gov March of Dimes: www.marchofdimes.org/pregnancy Contact a health care provider if:  You use marijuana and want to get pregnant.  You use marijuana during pregnancy or breastfeeding.  You need help stopping marijuana use. Get help right away if:  Your baby is not gaining weight or growing as expected. Summary  Using marijuana in  any form may be harmful for you and your baby when you are trying to become pregnant, during pregnancy, and during breastfeeding. This includes marijuana that is prescribed to you (medical marijuana).  Some studies suggest that marijuana may pass through breast milk and can affect your baby's brain development.  Talk to your health care provider if you use marijuana in any form while trying to get pregnant, during pregnancy, or while breastfeeding.  Ask your health care provider for help if you are not able to stop using marijuana. This information is not intended to replace advice given to you by your health care provider. Make sure you discuss any questions you have with your health care provider. Document Revised: 12/24/2018 Document Reviewed: 05/20/2017 Elsevier Patient Education  [place]: You are not alone, Seventy-five percent of women have some sort of abdominal or back pain at some point in their pregnancy. Your baby is growing at a fast pace, which means that your whole body is rapidly trying to adjust to the changes. As your uterus grows, your back may start feeling a bit under stress and this can result in back or abdominal pain that can go from mild, and therefore bearable, to severe pains that will not allow you to sit or lay down comfortably, When it comes to dealing with pregnancy-related pains and cramps, some pregnant women usually prefer natural remedies, which the market is filled with nowadays. For example, wearing a pregnancy support belt can help ease and lessen your discomfort and pain. WHAT ARE THE BENEFITS OF WEARING A PREGNANCY SUPPORT BELT? A pregnancy support belt provides support to the lower portion of the belly taking some of the weight of the growing uterus and distributing to the other parts of your body. It is designed make you comfortable and gives you extra support. Over the years, the pregnancy apparel market has been  studying the needs and wants of pregnant women and they  have come up with the most comfortable pregnancy support belts that woman could ever ask for. In fact, you will no longer have to wear a stretched-out or bulky pregnancy belt that is visible underneath your clothes and makes you feel even more uncomfortable. Nowadays, a pregnancy support belt is made of comfortable and stretchy materials that will not irritate your skin but will actually make you feel at ease and you will not even notice you are wearing it. They are easy to put on and adjust during the day and can be worn at night for additional support.  BENEFITS: . Relives Back pain . Relieves Abdominal Muscle and Leg Pain . Stabilizes the Pelvic Ring . Offers a Cushioned Abdominal Lift Pad . Relieves pressure on the Sciatic Nerve Within Minutes WHERE TO GET YOUR PREGNANCY BELT: [employer] [phone] @[id number]  [address] [place], [place]       Cannabinoid Hyperemesis Syndrome Cannabinoid hyperemesis syndrome (CHS) is a condition that causes repeated nausea, vomiting, and abdominal pain after long-term (chronic) use of marijuana (cannabis). People with CHS typically use marijuana 3-5 times a day for many years before they have symptoms, although it is possible to develop CHS with as little as 1 use per day. Symptoms of CHS may be mild at first but can get worse and more frequent. In some cases, CHS may cause vomiting many times a day, which can lead to weight loss and dehydration. CHS may go away and come back many times (recur). People may not have symptoms or may otherwise be healthy in between [facility] attacks. What are the causes? The exact cause of this condition is not known. Long-term use of marijuana may over-stimulate certain proteins in the brain that react with chemicals in marijuana (cannabinoid receptors). This over-stimulation may cause CHS. What are the signs or symptoms? Symptoms of this condition are  often mild during the first few attacks, but they can get worse over time. Symptoms may include:  Frequent nausea, especially early in the morning.  Vomiting.  Abdominal pain. Taking several hot showers throughout the day can also be a sign of this condition. People with CHS may do this because it relieves symptoms. How is this diagnosed? This condition may be diagnosed based on:  Your symptoms and medical history, including any drug use.  A physical exam. You may have tests done to rule out other problems. These tests may include:  Blood tests.  Urine tests.  Imaging tests, such as an X-ray or CT scan. How is this treated? Treatment for this condition involves stopping marijuana use. Your health care provider may recommend:  A drug rehabilitation program, if you have trouble stopping marijuana use.  Medicines for nausea.  Hot showers to help relieve symptoms. Certain creams that contain a substance called capsaicin may improve symptoms when applied to the abdomen. Ask your health care provider before starting any medicines or other treatments. Severe nausea and vomiting may require you to stay at the hospital. You may need IV fluids to prevent or treat dehydration. You may also need certain medicines that must be given at the hospital. Follow these instructions at home: During an attack   Stay in bed and rest in a dark, quiet room.  Take anti-nausea medicine as told by your health care provider.  Try taking hot showers to relieve your symptoms. After an attack  Drink small amounts of clear fluids slowly. Gradually add more.  Once you are able to eat without vomiting,  eat soft foods in small amounts every 3-4 hours. General instructions   Do not use any products that contain marijuana.If you need help quitting, ask your health care provider for resources and treatment options.  Drink enough fluid to keep your urine pale yellow. Avoid drinking fluids that have a lot  of sugar or caffeine, such as coffee and soda.  Take and apply over-the-counter and prescription medicines only as told by your health care provider. Ask your health care provider before starting any new medicines or treatments.  Keep all follow-up visits as told by your health care provider. This is important. Contact a health care provider if:  Your symptoms get worse.  You cannot drink fluids without vomiting.  You have pain and trouble swallowing after an attack. Get help right away if:  You cannot stop vomiting.  You have blood in your vomit or your vomit looks like coffee grounds.  You have severe abdominal pain.  You have stools that are bloody or black, or stools that look like tar.  You have symptoms of dehydration, such as: ? Sunken eyes. ? Inability to make tears. ? Cracked lips. ? Dry mouth. ? Decreased urine production. ? Weakness. ? Sleepiness. ? Fainting. Summary  Cannabinoid hyperemesis syndrome (CHS) is a condition that causes repeated nausea, vomiting, and abdominal pain after long-term use of marijuana.  People with CHS typically use marijuana 3-5 times a day for many years before they have symptoms, although it is possible to develop CHS with as little as 1 use per day.  Treatment for this condition involves stopping marijuana use. Hot showers and capsaicin creams may also help relieve symptoms. Ask your health care provider before starting any medicines or other treatments.  Your health care provider may prescribe medicines to help with nausea.  Get help right away if you have signs of dehydration, such as dry mouth, decreased urine production, or weakness. This information is not intended to replace advice given to you by your health care provider. Make sure you discuss any questions you have with your health care provider. Document Revised: 01/08/2018 Document Reviewed: 12/10/2016 Elsevier Patient Education  2020 Elsevier Inc.        Morning  Sickness  Morning sickness is when a woman feels nauseous during pregnancy. This nauseous feeling may or may not come with vomiting. It often occurs in the morning, but it can be a problem at any time of day. Morning sickness is most common during the first trimester. In some cases, it may continue throughout pregnancy. Although morning sickness is unpleasant, it is usually harmless unless the woman develops severe and continual vomiting (hyperemesis gravidarum), a condition that requires more intense treatment. What are the causes? The exact cause of this condition is not known, but it seems to be related to normal hormonal changes that occur in pregnancy. What increases the risk? You are more likely to develop this condition if:  You experienced nausea or vomiting before your pregnancy.  You had morning sickness during a previous pregnancy.  You are pregnant with more than one baby, such as twins. What are the signs or symptoms? Symptoms of this condition include:  Nausea.  Vomiting. How is this diagnosed? This condition is usually diagnosed based on your signs and symptoms. How is this treated? In many cases, treatment is not needed for this condition. Making some changes to what you eat may help to control symptoms. Your health care provider may also prescribe or recommend:  Vitamin B6 supplements.  Anti-nausea medicines.  Ginger. Follow these instructions at home: Medicines  Take over-the-counter and prescription medicines only as told by your health care provider. Do not use any prescription, over-the-counter, or herbal medicines for morning sickness without first talking with your health care provider.  Taking multivitamins before getting pregnant can prevent or decrease the severity of morning sickness in most women. Eating and drinking  Eat a piece of dry toast or crackers before getting out of bed in the morning.  Eat 5 or 6 small meals a day.  Eat dry and bland  foods, such as rice or a baked potato. Foods that are high in carbohydrates are often helpful.  Avoid greasy, fatty, and spicy foods.  Have someone cook for you if the smell of any food causes nausea and vomiting.  If you feel nauseous after taking prenatal vitamins, take the vitamins at night or with a snack.  Snack on protein foods between meals if you are hungry. Nuts, yogurt, and cheese are good options.  Drink fluids throughout the day.  Try ginger ale made with real ginger, ginger tea made from fresh grated ginger, or ginger candies. General instructions  Do not use any products that contain nicotine or tobacco, such as cigarettes and e-cigarettes. If you need help quitting, ask your health care provider.  Get an air purifier to keep the air in your house free of odors.  Get plenty of fresh air.  Try to avoid odors that trigger your nausea.  Consider trying these methods to help relieve symptoms: ? Wearing an acupressure wristband. These wristbands are often worn for seasickness. ? Acupuncture. Contact a health care provider if:  Your home remedies are not working and you need medicine.  You feel dizzy or light-headed.  You are losing weight. Get help right away if:  You have persistent and uncontrolled nausea and vomiting.  You faint.  You have severe pain in your abdomen. Summary  Morning sickness is when a woman feels nauseous during pregnancy. This nauseous feeling may or may not come with vomiting.  Morning sickness is most common during the first trimester.  It often occurs in the morning, but it can be a problem at any time of day.  In many cases, treatment is not needed for this condition. Making some changes to what you eat may help to control symptoms. This information is not intended to replace advice given to you by your health care provider. Make sure you discuss any questions you have with your health care provider. Document Revised: 08/14/2017  Document Reviewed: 10/04/2016 Elsevier Patient Education  2020 [employer].

## 2020-02-14 NOTE — MAU Note (Signed)
Since yesterday, been having a lot of pelvic pain and pressure.  Been throwing up and has a bad headache. Has not taken any meds for these things.

## 2020-02-14 NOTE — MAU Provider Note (Signed)
History     CSN: [id number]  Arrival date and time: 02/14/20 1505   First Provider Initiated Contact with Patient 02/14/20 1646      Chief Complaint  Patient presents with   Pelvic Pain   Headache   Emesis   [name] is a 34 y.o. [id number] at 6712w0d who presents to MAU for nausea and vomiting. Patient reports she has been struggling with nausea and vomiting her entire pregnancy, but stopped about 1.5 months ago. Patient reports she stopped taking her nausea medication about 2 months ago. Patient denies smoking marijuana. Patient denies sick contacts and denies diarrhea. Patient reports she has vomited 4 times in the past 24 hours. Patient reports she vomits immediately after eating. Patient reports LBP that has been going on for about one week and has not worsened since that time. Patient denies intercourse in the past 24 hours. Patient reports she has not been able to eat anything in the past 24 hours but can drink apple juice and water. Patient reports earlier in pregnancy she was taking Zofran, which worked for her. Patient also endorses a runny nose that started 4 days ago and denies allergies. Patient denies COVID vaccination.  Patient also reports pelvic pain x1 month that worsened over the past two weeks. Patient reports it is difficult for her to "turn in the bed" and "get her legs in the bed." Patient reports the pain is worse with walking and movement and is absent when lying down and resting without movement. Patient endorses "popping sensation" mid-pelvis that occurs when she walks and leans more heavily on her right leg than left leg when she walks and feels like she is limping. Patient reports she does not have a pregnancy support belt at home.  Pt denies VB, LOF, ctx, vaginal discharge/odor/itching. Pt denies abdominal pain, constipation, diarrhea, or urinary problems. Pt denies fever, chills, fatigue, sweating or changes in appetite. Pt denies SOB or chest pain. Pt  denies dizziness, HA, light-headedness, weakness.  Problems this pregnancy include: hx PTD @25weeks . Allergies? NKDA Current medications/supplements? PNVs Prenatal care provider? [facility], next appt 03/01/2020   OB History    Gravida  5   Para  4   Term  3   Preterm  1   AB      Living  4     SAB  0   TAB  0   Ectopic      Multiple      Live Births  4           Past Medical History:  Diagnosis Date   Medical history non-contributory     Past Surgical History:  Procedure Laterality Date   NO PAST SURGERIES      Family History  Problem Relation Age of Onset   Hypertension Mother     Social History   Tobacco Use   Smoking status: Never Smoker   Smokeless tobacco: Never Used  Substance Use Topics   Alcohol use: Not Currently   Drug use: Never    Allergies: No Known Allergies  Medications Prior to Admission  Medication Sig Dispense Refill Last Dose   Prenatal MV & Min w/FA-DHA (PRENATAL ADULT GUMMY/DHA/FA) 0.4-25 MG CHEW Chew 1 each by mouth daily. 60 tablet 0 02/14/2020 at Unknown time   Doxylamine-Pyridoxine 10-10 MG TBEC Take 2 tablets by mouth at bedtime. 60 tablet 0 More than a month at Unknown time   famotidine (PEPCID) 20 MG tablet Take 1 tablet (20 mg  total) by mouth daily. 30 tablet 0 More than a month at Unknown time   glycopyrrolate (ROBINUL) 1 MG tablet Take 1 tablet (1 mg total) by mouth 3 (three) times daily. 90 tablet 0 More than a month at Unknown time   ondansetron (ZOFRAN ODT) 4 MG disintegrating tablet Take 1 tablet (4 mg total) by mouth every 8 (eight) hours as needed for nausea or vomiting. 20 tablet 0 More than a month at Unknown time   promethazine (PHENERGAN) 12.5 MG tablet Take 1 tablet (12.5 mg total) by mouth every 6 (six) hours as needed for nausea or vomiting. 30 tablet 0 More than a month at Unknown time    Review of Systems  Constitutional: Negative for chills, diaphoresis, fatigue and fever.  HENT: Positive  for rhinorrhea. Negative for sneezing and sore throat.   Eyes: Negative for visual disturbance.  Respiratory: Negative for cough and shortness of breath.   Cardiovascular: Negative for chest pain.  Gastrointestinal: Positive for nausea and vomiting. Negative for abdominal pain, constipation and diarrhea.  Genitourinary: Positive for pelvic pain. Negative for dysuria, flank pain, frequency, urgency, vaginal bleeding and vaginal discharge.  Musculoskeletal: Positive for back pain (LBP).  Neurological: Negative for dizziness, weakness, light-headedness and headaches.   Physical Exam   Blood pressure 101/63, pulse 91, temperature 98.2 F (36.8 C), temperature source Oral, resp. rate 16, height 5\' 1"  (1.549 m), weight 84 kg, last menstrual period 08/30/2019, SpO2 100 %.  Patient Vitals for the past 24 hrs:  BP Temp Temp src Pulse Resp SpO2 Height Weight  02/14/20 1952 101/63 -- -- 91 -- -- -- --  02/14/20 1951 -- 98.2 F (36.8 C) Oral -- 16 100 % -- --  02/14/20 1525 98/66 98.7 F (37.1 C) Oral (!) 107 18 100 % 5\' 1"  (1.549 m) 84 kg   Physical Exam  Constitutional: She is oriented to person, place, and time. She appears well-developed and well-nourished. No distress.  HENT:  Head: Normocephalic and atraumatic.  Respiratory: Effort normal.  Musculoskeletal:     Comments: Pain with pressure directly over symphysis pubis, patient able to raise right leg but unable to raise left leg without pain.  Neurological: She is alert and oriented to person, place, and time.  Skin: She is not diaphoretic.  Psychiatric: She has a normal mood and affect. Her behavior is normal. Judgment and thought content normal.   Results for orders placed or performed during the hospital encounter of 02/14/20 (from the past 24 hour(s))  Urinalysis, Routine w reflex microscopic     Status: Abnormal   Collection Time: 02/14/20  4:07 PM  Result Value Ref Range   Color, Urine YELLOW YELLOW   APPearance HAZY (A) CLEAR    Specific Gravity, Urine 1.031 (H) 1.005 - 1.030   pH 6.0 5.0 - 8.0   Glucose, UA NEGATIVE NEGATIVE mg/dL   Hgb urine dipstick NEGATIVE NEGATIVE   Bilirubin Urine NEGATIVE NEGATIVE   Ketones, ur 20 (A) NEGATIVE mg/dL   Protein, ur 30 (A) NEGATIVE mg/dL   Nitrite NEGATIVE NEGATIVE   Leukocytes,Ua NEGATIVE NEGATIVE   RBC / HPF 0-5 0 - 5 RBC/hpf   WBC, UA 0-5 0 - 5 WBC/hpf   Bacteria, UA FEW (A) NONE SEEN   Squamous Epithelial / LPF 11-20 0 - 5   Mucus PRESENT   Urine rapid drug screen (hosp performed)     Status: Abnormal   Collection Time: 02/14/20  5:05 PM  Result Value Ref Range  Opiates NONE DETECTED NONE DETECTED   Cocaine NONE DETECTED NONE DETECTED   Benzodiazepines NONE DETECTED NONE DETECTED   Amphetamines NONE DETECTED NONE DETECTED   Tetrahydrocannabinol POSITIVE (A) NONE DETECTED   Barbiturates NONE DETECTED NONE DETECTED  SARS Coronavirus 2 by RT PCR (hospital order, performed in [facility] hospital lab) Nasopharyngeal Nasopharyngeal Swab     Status: None   Collection Time: 02/14/20  5:27 PM   Specimen: Nasopharyngeal Swab  Result Value Ref Range   SARS Coronavirus 2 NEGATIVE NEGATIVE  CBC with Differential/Platelet     Status: Abnormal   Collection Time: 02/14/20  6:28 PM  Result Value Ref Range   WBC 10.1 4.0 - 10.5 K/uL   RBC 3.97 3.87 - 5.11 MIL/uL   Hemoglobin 11.8 (L) 12.0 - 15.0 g/dL   HCT 16.1 09.6 - 04.5 %   MCV 91.2 80.0 - 100.0 fL   MCH 29.7 26.0 - 34.0 pg   MCHC 32.6 30.0 - 36.0 g/dL   RDW 40.9 81.1 - 91.4 %   Platelets 254 150 - 400 K/uL   nRBC 0.0 0.0 - 0.2 %   Neutrophils Relative % 63 %   Neutro Abs 6.3 1.7 - 7.7 K/uL   Lymphocytes Relative 27 %   Lymphs Abs 2.8 0.7 - 4.0 K/uL   Monocytes Relative 7 %   Monocytes Absolute 0.7 0.1 - 1.0 K/uL   Eosinophils Relative 2 %   Eosinophils Absolute 0.2 0.0 - 0.5 K/uL   Basophils Relative 0 %   Basophils Absolute 0.0 0.0 - 0.1 K/uL   Immature Granulocytes 1 %   Abs Immature Granulocytes 0.07  0.00 - 0.07 K/uL  Comprehensive metabolic panel     Status: Abnormal   Collection Time: 02/14/20  6:28 PM  Result Value Ref Range   Sodium 135 135 - 145 mmol/L   Potassium 3.6 3.5 - 5.1 mmol/L   Chloride 103 98 - 111 mmol/L   CO2 23 22 - 32 mmol/L   Glucose, Bld 90 70 - 99 mg/dL   BUN 14 6 - 20 mg/dL   Creatinine, Ser 7.82 0.44 - 1.00 mg/dL   Calcium 8.6 (L) 8.9 - 10.3 mg/dL   Total Protein 5.8 (L) 6.5 - 8.1 g/dL   Albumin 2.6 (L) 3.5 - 5.0 g/dL   AST 15 15 - 41 U/L   ALT 11 0 - 44 U/L   Alkaline Phosphatase 59 38 - 126 U/L   Total Bilirubin 0.2 (L) 0.3 - 1.2 mg/dL   GFR calc non Af Amer >60 >60 mL/min   GFR calc Af Amer >60 >60 mL/min   Anion gap 9 5 - 15   [place] MFM OB DETAIL +14 WK  Result Date: 01/17/2020 ----------------------------------------------------------------------  OBSTETRICS REPORT                       (Signed Final 01/17/2020 09:18 am) ---------------------------------------------------------------------- Patient Info  ID #:       [id number]                          D.O.B.:  12-08-1985 (33 yrs)  Name:       Chryl Heck Degante               Visit Date: 01/17/2020 07:45 am ---------------------------------------------------------------------- Performed By  Attending:        Noralee Space MD        Ref. Address:  [facility]                                                             Obstetrics &                                                             Gynecology                                                             [address]                                                             [address]                                                             [place], [place]                                                             [address]  Performed By:     Percell Boston          Location:         [facility]                                     [facility]  Referred By:       Nigel Bridgeman                    CNM ---------------------------------------------------------------------- Orders  #  Description                           Code        Ordered By  1  [place] MFM OB  DETAIL +14 WK               76811.01    Nigel Bridgeman ----------------------------------------------------------------------  #  Order #                     Accession #                Episode #  1  [id number]                   [id number]                 [id number] ---------------------------------------------------------------------- Indications  [redacted] weeks gestation of pregnancy                Z3A.20  Encounter for antenatal screening for          Z36.3  malformations  Poor obstetric history: Previous preterm       O09.219  delivery, antepartum (two full term after)  Obesity complicating pregnancy, second         O99.212  trimester ---------------------------------------------------------------------- Fetal Evaluation  Num Of Fetuses:         1  Fetal Heart Rate(bpm):  145  Cardiac Activity:       Observed  Presentation:           Cephalic  Placenta:               Anterior  P. Cord Insertion:      Visualized, central  Amniotic Fluid  AFI FV:      Within normal limits                              Largest Pocket(cm)                              4.2 ---------------------------------------------------------------------- Biometry  BPD:      47.4  mm     G. Age:  20w 2d         64  %    CI:        73.69   %    70 - 86                                                          FL/HC:      18.5   %    16.8 - 19.8  HC:      175.4  mm     G. Age:  20w 0d         44  %    HC/AC:      1.18        1.09 - 1.39  AC:      148.4  mm     G. Age:  20w 1d         48  %    FL/BPD:     68.4   %  FL:       32.4  mm     G. Age:  20w 1d         46  %    FL/AC:      21.8   %    20 -  24  HUM:      30.9  mm     G. Age:  20w 2d         60  %  CER:      22.1  mm     G. Age:  21w 0d         67  %  CM:        3.5  mm  Est. FW:     333  gm    0 lb 12 oz      52  %  ---------------------------------------------------------------------- OB History  Gravidity:    5         Term:   3        Prem:   1        SAB:   0  TOP:          0       Ectopic:  0        Living: 4 ---------------------------------------------------------------------- Gestational Age  LMP:           20w 0d        Date:  08/30/19                 EDD:   06/05/20  U/S Today:     20w 1d                                        EDD:   06/04/20  Best:          Hyacinth Meeker 0d     Det. By:  LMP  (08/30/19)          EDD:   06/05/20 ---------------------------------------------------------------------- Anatomy  Cranium:               Appears normal         LVOT:                   Appears normal  Cavum:                 Appears normal         Aortic Arch:            Not well visualized  Ventricles:            Appears normal         Ductal Arch:            Not well visualized  Choroid Plexus:        Appears normal         Diaphragm:              Appears normal  Cerebellum:            Appears normal         Stomach:                Appears normal, left                                                                        sided  Posterior Fossa:       Appears normal  Abdomen:                Appears normal  Nuchal Fold:           Appears normal         Abdominal Wall:         Appears nml (cord                                                                        insert, abd wall)  Face:                  Appears normal         Cord Vessels:           Appears normal (3                         (orbits and profile)                           vessel cord)  Lips:                  Appears normal         Kidneys:                Appear normal  Palate:                Not well visualized    Bladder:                Appears normal  Thoracic:              Appears normal         Spine:                  Appears normal  Heart:                 Not well visualized    Upper Extremities:      Appears normal  RVOT:                  Appears normal          Lower Extremities:      Appears normal  Other:  Nasal bone visualized. Heels and 5th digit visualized. Technically          difficult due to fetal position. ---------------------------------------------------------------------- Cervix Uterus Adnexa  Cervix  Length:            3.3  cm.  Normal appearance by transabdominal scan.  Uterus  No abnormality visualized.  Right Ovary  No adnexal mass visualized.  Left Ovary  No adnexal mass visualized.  Cul De Sac  No free fluid seen.  Adnexa  No abnormality visualized. ---------------------------------------------------------------------- Impression  G5 P4.  History of preterm delivery followed by 3 term vaginal  deliveries.  Patient reports no chronic medical conditions.  On  cell free fetal DNA screening, the risks of fetal aneuploidies  are not increased (as reported by the patient).  We performed fetal anatomy scan. No makers of  aneuploidies or fetal structural defects are seen. Fetal  biometry is consistent with her previously-established dates.  Amniotic fluid is normal  and good fetal activity is seen.  Patient understands the limitations of ultrasound in detecting  fetal anomalies.  Maternal obesity imposes limitations on the resolution of  images, and failure to detect fetal anomalies is more common  in obese pregnant women. ---------------------------------------------------------------------- Recommendations  -An appointment was made for her to return in 4 weeks for  completion of fetal anatomy. ----------------------------------------------------------------------                  Noralee Space, MD Electronically Signed Final Report   01/17/2020 09:18 am ----------------------------------------------------------------------  MAU Course  Procedures  MDM -new onset runny nose, nausea and vomiting without sick contacts -pt has not had COVID vaccine -N/V in pregnancy -UA: hazy/SG 1.031/20 ketones/30PRO/few bacteria, sending urine for culture based on  symptoms -UDS: +THC -CBC w/Diff: WNL for pregnancy -CMP: no abnormalities requiring treatment -COVID: negative -1L LR + 20mg  Pepcid + 8mg  Zofran given, pt reports NV now resolved -pt able to urinate after fluids -PO challenge successful  -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/pelvic girdle dysfunction, r/o PTL -CE: long/closed/posterior -fFN: not performed d/t clinical presentation -WetPrep: results pending at time of discharge -GC/CT collected -EFM: reactive       -baseline: 145       -variability: moderate       -accels: present, 15x15       -decels: absent       -TOCO: quiescent  -pt discharged to home in stable condition  Orders Placed This Encounter  Procedures   SARS Coronavirus 2 by RT PCR (hospital order, performed in [facility] hospital lab) Nasopharyngeal Nasopharyngeal Swab    Standing Status:   Standing    Number of Occurrences:   1    Order Specific Question:   Is this test for diagnosis or screening    Answer:   Diagnosis of ill patient    Order Specific Question:   Symptomatic for COVID-19 as defined by CDC    Answer:   Yes    Order Specific Question:   Date of Symptom Onset    Answer:   02/10/2020    Order Specific Question:   Hospitalized for COVID-19    Answer:   Yes    Order Specific Question:   Admitted to ICU for COVID-19    Answer:   No    Order Specific Question:   Previously tested for COVID-19    Answer:   Yes    Order Specific Question:   Resident in a congregate (group) care setting    Answer:   No    Order Specific Question:   Employed in healthcare setting    Answer:   No    Order Specific Question:   Pregnant    Answer:   Yes    Order Specific Question:   Has patient completed COVID vaccination(s) (2 doses of Pfizer/Moderna 1 dose of [facility])    Answer:   No   Culture, OB Urine    Standing Status:   Standing    Number of Occurrences:   1   Wet prep, genital    Standing Status:   Standing    Number of Occurrences:   1    Urinalysis, Routine w reflex microscopic    Standing Status:   Standing    Number of Occurrences:   1   Urine rapid drug screen (hosp performed)    Standing Status:   Standing    Number of Occurrences:   1   CBC with Differential/Platelet  Standing Status:   Standing    Number of Occurrences:   1   Comprehensive metabolic panel    Standing Status:   Standing    Number of Occurrences:   1   Ambulatory referral to Physical Therapy    Referral Priority:   Routine    Referral Type:   Physical Medicine    Referral Reason:   Specialty Services Required    Requested Specialty:   Physical Therapy    Number of Visits Requested:   1   Airborne and Contact precautions    Standing Status:   Standing    Number of Occurrences:   1   Insert peripheral IV    Standing Status:   Standing    Number of Occurrences:   1   Discharge patient    Order Specific Question:   Discharge disposition    Answer:   01-Home or Self Care [1]    Order Specific Question:   Discharge patient date    Answer:   02/14/2020   Meds ordered this encounter  Medications   lactated ringers bolus 1,000 mL   ondansetron (ZOFRAN) 8 mg in sodium chloride 0.9 % 50 mL IVPB   famotidine (PEPCID) IVPB 20 mg premix   Elastic Bandages & Supports (COMFORT FIT MATERNITY SUPP SM) MISC    Sig: 1 Units by Does not apply route daily as needed.    Dispense:  1 each    Refill:  0    Order Specific Question:   Supervising Provider    Answer:   Adam Phenix [[id number]]    Assessment and Plan   1. Nausea and vomiting in pregnancy   2. Marijuana use   3. [redacted] weeks gestation of pregnancy   4. NST (non-stress test) reactive   5. Pelvic pain in pregnancy     Allergies as of 02/14/2020   No Known Allergies     Medication List    TAKE these medications   Comfort Fit Maternity Supp Sm Misc 1 Units by Does not apply route daily as needed.   Doxylamine-Pyridoxine 10-10 MG Tbec Take 2 tablets by mouth at bedtime.    famotidine 20 MG tablet Commonly known as: PEPCID Take 1 tablet (20 mg total) by mouth daily.   glycopyrrolate 1 MG tablet Commonly known as: Robinul Take 1 tablet (1 mg total) by mouth 3 (three) times daily.   ondansetron 4 MG disintegrating tablet Commonly known as: Zofran ODT Take 1 tablet (4 mg total) by mouth every 8 (eight) hours as needed for nausea or vomiting.   Prenatal Adult Gummy/DHA/FA 0.4-25 MG Chew Chew 1 each by mouth daily.   promethazine 12.5 MG tablet Commonly known as: PHENERGAN Take 1 tablet (12.5 mg total) by mouth every 6 (six) hours as needed for nausea or vomiting.      -will call with culture results, if positive -RX pregnancy support belt -referral to pelvic PT at [facility] -encouraged to quit marijuana, discussed link between hyperemesis and marijuana use, discouraged use in pregnancy specifically -return MAU precautions given -pt discharged to home in stable condition  Joni Reining E Dabria Wadas 02/14/2020, 8:10 PM

## 2020-02-14 NOTE — MAU Note (Deleted)
Pt had left after blood was drawn.  [place] NP made aware.  Checked again in lobby, not there.  Results are back,  NP will try to reach pt on phone

## 2020-02-14 NOTE — MAU Note (Addendum)
States feels like heart is racing (no prior hx), started when she was vomiting yesterday.

## 2020-02-15 LAB — CULTURE, OB URINE: Culture: NO GROWTH

## 2020-02-15 LAB — GC/CHLAMYDIA PROBE AMP (~~LOC~~) NOT AT ARMC
Chlamydia: NEGATIVE
Comment: NEGATIVE
Comment: NORMAL
Neisseria Gonorrhea: NEGATIVE

## 2020-03-20 ENCOUNTER — Encounter: Payer: Medicaid Other | Attending: Physical Therapy | Admitting: Physical Therapy

## 2020-03-27 ENCOUNTER — Encounter: Payer: Medicaid Other | Admitting: Physical Therapy

## 2020-04-03 ENCOUNTER — Encounter: Payer: Medicaid Other | Admitting: Physical Therapy

## 2020-04-10 ENCOUNTER — Encounter: Payer: Medicaid Other | Admitting: Physical Therapy

## 2020-05-08 LAB — OB RESULTS CONSOLE GBS: GBS: NEGATIVE

## 2020-05-21 ENCOUNTER — Inpatient Hospital Stay (HOSPITAL_COMMUNITY)
Admission: AD | Admit: 2020-05-21 | Discharge: 2020-05-21 | Disposition: A | Discharge status: Home / Self Care | Payer: Medicaid Other | Attending: Obstetrics & Gynecology | Admitting: Obstetrics & Gynecology

## 2020-05-21 ENCOUNTER — Encounter (HOSPITAL_COMMUNITY): Payer: Self-pay | Admitting: Obstetrics & Gynecology

## 2020-05-21 ENCOUNTER — Other Ambulatory Visit: Payer: Self-pay

## 2020-05-21 DIAGNOSIS — Z3A37 37 weeks gestation of pregnancy: Secondary | ICD-10-CM | POA: Insufficient documentation

## 2020-05-21 DIAGNOSIS — O479 False labor, unspecified: Secondary | ICD-10-CM

## 2020-05-21 DIAGNOSIS — O471 False labor at or after 37 completed weeks of gestation: Secondary | ICD-10-CM | POA: Insufficient documentation

## 2020-05-21 LAB — POCT FERN TEST: POCT Fern Test: NEGATIVE

## 2020-05-21 NOTE — MAU Note (Signed)
Pt reports ctx started x 2 days . Got worse today q 10 min Having ctx and feeling pulling in her pelvic area. Only urinated x1 today. Good fetal movement reported. Denies any vag bleeding. Stated she a a small wet spot on her sheet but none since this morning.  1.5 cm dilated 2 weeks ago.

## 2020-05-21 NOTE — MAU Provider Note (Signed)
S: Ms. OPHELIA SIPE is a 34 y.o. [id number] at [redacted]w[redacted]d  who presents to MAU today complaining of leaking of fluid since this morning. She denies vaginal bleeding. She endorses contractions. She reports normal fetal movement.    O: BP 116/67 (BP Location: Right Arm)   Pulse 94   Temp 98.5 F (36.9 C)   Resp 16   Ht 5\' 8"  (1.727 m)   Wt 89.8 kg   LMP 08/30/2019 (Approximate) Comment: December 2020  SpO2 100%   BMI 30.11 kg/m  GENERAL: Well-developed, well-nourished female in no acute distress.  HEAD: Normocephalic, atraumatic.  CHEST: Normal effort of breathing, regular heart rate ABDOMEN: Soft, nontender, gravid PELVIC: Normal external female genitalia. Vagina is pink and rugated. Cervix with normal contour, no lesions. Normal discharge.  Negative pooling; thick white discharge.   Cervical exam:  Dilation: 1.5 Effacement (%): 60 Station: -2 Presentation: Vertex Exam by:: [email or web address]   Fetal Monitoring: Baseline: 130 bpm  Variability: moderate  Accelerations: 15x15 Decelerations: None Contractions: Occasional   Results for orders placed or performed during the hospital encounter of 05/21/20 (from the past 24 hour(s))  POCT fern test     Status: None   Collection Time: 05/21/20  5:19 PM  Result Value Ref Range   POCT Fern Test Negative = intact amniotic membranes      A: SIUP at [redacted]w[redacted]d  Membranes intact  P:  Cervix unchanged from previous exam. DC home Strict labor precautions Return to MAU if symptoms worsen.   [redacted]w[redacted]d, NP 05/21/2020 7:51 PM

## 2020-05-21 NOTE — MAU Note (Signed)
I have communicated with Venia Carbon, NP and reviewed vital signs:  Vitals:   05/21/20 1854 05/21/20 1939  BP: 111/72 116/67  Pulse: (!) 103 94  Resp: 18 16  Temp:    SpO2:  100%    Vaginal exam:  Dilation: 1.5 Effacement (%): 60 Station: -2 Presentation: Vertex Exam by:: Enriqueta Shutter,   Also reviewed contraction pattern and that non-stress test is reactive.  It has been documented that patient is contracting every 3-6 minutes with minimal cervical change over 1 hour not indicating active labor.  Patient denies any other complaints and is okay with going home with labor precaustions.  Based on this report provider has given order for discharge.  A discharge order and diagnosis entered by a provider.   Labor discharge instructions reviewed with patient.

## 2020-05-31 ENCOUNTER — Inpatient Hospital Stay (HOSPITAL_COMMUNITY)
Admission: AD | Admit: 2020-05-31 | Discharge: 2020-06-03 | DRG: 798 | Disposition: A | Discharge status: Home / Self Care | Payer: Medicaid Other | Attending: Obstetrics & Gynecology | Admitting: Obstetrics & Gynecology

## 2020-05-31 ENCOUNTER — Inpatient Hospital Stay (HOSPITAL_COMMUNITY): Payer: Medicaid Other | Admitting: Anesthesiology

## 2020-05-31 ENCOUNTER — Encounter (HOSPITAL_COMMUNITY): Payer: Self-pay | Admitting: Obstetrics & Gynecology

## 2020-05-31 ENCOUNTER — Other Ambulatory Visit: Payer: Self-pay

## 2020-05-31 DIAGNOSIS — O26893 Other specified pregnancy related conditions, third trimester: Secondary | ICD-10-CM | POA: Diagnosis present

## 2020-05-31 DIAGNOSIS — Z20822 Contact with and (suspected) exposure to covid-19: Secondary | ICD-10-CM | POA: Diagnosis present

## 2020-05-31 DIAGNOSIS — Z302 Encounter for sterilization: Secondary | ICD-10-CM | POA: Diagnosis not present

## 2020-05-31 DIAGNOSIS — Z3A39 39 weeks gestation of pregnancy: Secondary | ICD-10-CM

## 2020-05-31 LAB — CBC
HCT: 39.9 % (ref 36.0–46.0)
Hemoglobin: 12.9 g/dL (ref 12.0–15.0)
MCH: 29.5 pg (ref 26.0–34.0)
MCHC: 32.3 g/dL (ref 30.0–36.0)
MCV: 91.3 fL (ref 80.0–100.0)
Platelets: 254 10*3/uL (ref 150–400)
RBC: 4.37 MIL/uL (ref 3.87–5.11)
RDW: 13.8 % (ref 11.5–15.5)
WBC: 10.6 10*3/uL — ABNORMAL HIGH (ref 4.0–10.5)
nRBC: 0 % (ref 0.0–0.2)

## 2020-05-31 LAB — TYPE AND SCREEN
ABO/RH(D): O POS
Antibody Screen: NEGATIVE

## 2020-05-31 LAB — SARS CORONAVIRUS 2 BY RT PCR (HOSPITAL ORDER, PERFORMED IN ~~LOC~~ HOSPITAL LAB): SARS Coronavirus 2: NEGATIVE

## 2020-05-31 MED ORDER — FENTANYL CITRATE (PF) 2500 MCG/50ML IJ SOLN
INTRAMUSCULAR | Status: DC | PRN
Start: 2020-05-31 — End: 2020-05-31
  Administered 2020-05-31: 11 mL/h via EPIDURAL

## 2020-05-31 MED ORDER — FENTANYL CITRATE (PF) 100 MCG/2ML IJ SOLN
50.0000 ug | INTRAMUSCULAR | Status: DC | PRN
  Administered 2020-05-31: 50 ug via INTRAVENOUS
  Filled 2020-05-31: qty 2

## 2020-05-31 MED ORDER — BENZOCAINE-MENTHOL 20-0.5 % EX AERO: 1.0000 "application " | INHALATION_SPRAY | CUTANEOUS | Status: DC | PRN

## 2020-05-31 MED ORDER — DIPHENHYDRAMINE HCL 25 MG PO CAPS: 25.0000 mg | ORAL_CAPSULE | Freq: Four times a day (QID) | ORAL | Status: DC | PRN

## 2020-05-31 MED ORDER — ONDANSETRON HCL 4 MG/2ML IJ SOLN: 4.0000 mg | INTRAMUSCULAR | Status: DC | PRN

## 2020-05-31 MED ORDER — OXYCODONE-ACETAMINOPHEN 5-325 MG PO TABS: 1.0000 | ORAL_TABLET | ORAL | Status: DC | PRN

## 2020-05-31 MED ORDER — PHENYLEPHRINE 40 MCG/ML (10ML) SYRINGE FOR IV PUSH (FOR BLOOD PRESSURE SUPPORT): 80.0000 ug | PREFILLED_SYRINGE | INTRAVENOUS | Status: DC | PRN

## 2020-05-31 MED ORDER — ONDANSETRON HCL 4 MG/2ML IJ SOLN: 4.0000 mg | Freq: Four times a day (QID) | INTRAMUSCULAR | Status: DC | PRN

## 2020-05-31 MED ORDER — EPHEDRINE 5 MG/ML INJ: 10.0000 mg | INTRAVENOUS | Status: DC | PRN

## 2020-05-31 MED ORDER — ACETAMINOPHEN 325 MG PO TABS: 650.0000 mg | ORAL_TABLET | ORAL | Status: DC | PRN

## 2020-05-31 MED ORDER — SIMETHICONE 80 MG PO CHEW: 80.0000 mg | CHEWABLE_TABLET | ORAL | Status: DC | PRN

## 2020-05-31 MED ORDER — FENTANYL-BUPIVACAINE-NACL 0.5-0.125-0.9 MG/250ML-% EP SOLN
12.0000 mL/h | EPIDURAL | Status: DC | PRN
  Filled 2020-05-31: qty 250

## 2020-05-31 MED ORDER — SENNOSIDES-DOCUSATE SODIUM 8.6-50 MG PO TABS
2.0000 | ORAL_TABLET | ORAL | Status: DC
  Administered 2020-05-31 – 2020-06-03 (×3): 2 via ORAL
  Filled 2020-05-31 (×3): qty 2

## 2020-05-31 MED ORDER — LIDOCAINE HCL (PF) 1 % IJ SOLN: 30.0000 mL | INTRAMUSCULAR | Status: DC | PRN

## 2020-05-31 MED ORDER — LACTATED RINGERS IV SOLN: INTRAVENOUS | Status: DC

## 2020-05-31 MED ORDER — LACTATED RINGERS IV SOLN: 500.0000 mL | INTRAVENOUS | Status: DC | PRN

## 2020-05-31 MED ORDER — OXYTOCIN-SODIUM CHLORIDE 30-0.9 UT/500ML-% IV SOLN
2.5000 [IU]/h | INTRAVENOUS | Status: DC
  Administered 2020-05-31: 2.5 [IU]/h via INTRAVENOUS
  Filled 2020-05-31: qty 500

## 2020-05-31 MED ORDER — LIDOCAINE HCL (PF) 1 % IJ SOLN
INTRAMUSCULAR | Status: DC | PRN
  Administered 2020-05-31 (×2): 4 mL via EPIDURAL

## 2020-05-31 MED ORDER — WITCH HAZEL-GLYCERIN EX PADS: 1.0000 "application " | MEDICATED_PAD | CUTANEOUS | Status: DC | PRN

## 2020-05-31 MED ORDER — TETANUS-DIPHTH-ACELL PERTUSSIS 5-2.5-18.5 LF-MCG/0.5 IM SUSP: 0.5000 mL | Freq: Once | INTRAMUSCULAR | Status: DC

## 2020-05-31 MED ORDER — LACTATED RINGERS IV SOLN
500.0000 mL | Freq: Once | INTRAVENOUS | Status: AC
  Administered 2020-05-31: 500 mL via INTRAVENOUS

## 2020-05-31 MED ORDER — DIBUCAINE (PERIANAL) 1 % EX OINT: 1.0000 "application " | TOPICAL_OINTMENT | CUTANEOUS | Status: DC | PRN

## 2020-05-31 MED ORDER — COCONUT OIL OIL: 1.0000 "application " | TOPICAL_OIL | Status: DC | PRN

## 2020-05-31 MED ORDER — OXYTOCIN BOLUS FROM INFUSION
333.0000 mL | Freq: Once | INTRAVENOUS | Status: AC
  Administered 2020-05-31: 333 mL via INTRAVENOUS

## 2020-05-31 MED ORDER — OXYCODONE-ACETAMINOPHEN 5-325 MG PO TABS: 2.0000 | ORAL_TABLET | ORAL | Status: DC | PRN

## 2020-05-31 MED ORDER — IBUPROFEN 600 MG PO TABS
600.0000 mg | ORAL_TABLET | Freq: Four times a day (QID) | ORAL | Status: DC
  Administered 2020-05-31 – 2020-06-02 (×7): 600 mg via ORAL
  Filled 2020-05-31 (×7): qty 1

## 2020-05-31 MED ORDER — ONDANSETRON HCL 4 MG PO TABS: 4.0000 mg | ORAL_TABLET | ORAL | Status: DC | PRN

## 2020-05-31 MED ORDER — DIPHENHYDRAMINE HCL 50 MG/ML IJ SOLN: 12.5000 mg | INTRAMUSCULAR | Status: DC | PRN

## 2020-05-31 MED ORDER — PRENATAL MULTIVITAMIN CH
1.0000 | ORAL_TABLET | Freq: Every day | ORAL | Status: DC
  Administered 2020-06-01 – 2020-06-03 (×2): 1 via ORAL
  Filled 2020-05-31 (×2): qty 1

## 2020-05-31 MED ORDER — SOD CITRATE-CITRIC ACID 500-334 MG/5ML PO SOLN: 30.0000 mL | ORAL | Status: DC | PRN

## 2020-05-31 MED ORDER — ACETAMINOPHEN 325 MG PO TABS
650.0000 mg | ORAL_TABLET | ORAL | Status: DC | PRN
  Administered 2020-06-02 (×2): 650 mg via ORAL
  Filled 2020-05-31 (×2): qty 2

## 2020-05-31 NOTE — Progress Notes (Addendum)
Subjective:    Comfortable w/epidural. Agrees to amniotomy.   Objective:    VS: BP 104/81   Pulse 98   Temp 97.9 F (36.6 C) (Oral)   Resp 15   Ht 5\' 1"  (1.549 m)   Wt 90.6 kg   LMP 08/30/2019 (Approximate) Comment: December 2020  SpO2 98%   BMI 37.73 kg/m  FHR : baseline 125 / variability moderate / accelerations present / early decelerations Toco: contractions every 2-4 minutes Membranes: AROM, clear Dilation: 9 Effacement (%): 100 Cervical Position: Middle Station: 0 Presentation: Vertex Exam by:: [email or web address] CNM   Assessment/Plan:   34 y.o. 34 [redacted]w[redacted]d  Labor: Progressing normally Preeclampsia:  no signs or symptoms of toxicity Fetal Wellbeing:  Category I Pain Control:  Epidural I/D:  GBS negative Anticipated MOD:  NSVD  [redacted]w[redacted]d MSN, CNM 05/31/2020 4:32 PM  MD Addendum: I reviewed chart, reviewed fetal heart tracings and I agree with above findings, assessment and plan as outlined above by 06/02/2020 MSN, CNM.  Dr. [name]. 05/31/2020. 06/02/2020

## 2020-05-31 NOTE — MAU Note (Signed)
Started contracting an hour ago, "coming back to back". No bleeding or leaking. Was checked this morning, was 3 cm.  Denies problem with preg.

## 2020-05-31 NOTE — H&P (Signed)
OB ADMISSION/ HISTORY & PHYSICAL:  Admission Date: 05/31/2020 12:42 PM  Admit Diagnosis: SIUP @ 39+2, normal labor   Kayla Brandt is a 34 y.o. female [id number] [redacted]w[redacted]d presenting from office for labor eval. Endorses active FM, denies LOF and vaginal bleeding.   History of current pregnancy: [id number]   Patient entered care with [facility] at 12 wks.   EDC of 06/05/20 was established by LMP and congruent w/ 12 wk U/S.   Anatomy scan:  20 wks, with normal findings and anterior placenta.   Last evaluation: 9/9 Vtx/Ant placenta/AFI 13.6/EFW6# 14oz (3132g) FL lagging 7%  Significant prenatal events:  There are no problems to display for this patient.   Prenatal Labs: ABO, Rh: --/--/O POS, O POS Performed at [facility], [address] [place], [place] [address]  [phone]) Antibody: NEG (02/22 2106) Rubella:   Immune RPR:   NR  HBsAg:   NR HIV:   NR GTT: passed 1 hr GBS:   Neg GC/CHL: neg/neg Genetics: low-risk female, AFP normal, SMA carrier, declined partner testing Tdap/influenza vaccines: declined both   OB History  Gravida Para Term Preterm AB Living  5 4 3 1   4   SAB TAB Ectopic Multiple Live Births  0 0     4    # Outcome Date GA Lbr Len/2nd Weight Sex Delivery Anes PTL Lv  5 Current           4 Term 2012     Vag-Spont   LIV  3 Term 2009     Vag-Spont   LIV  2 Term 2007     Vag-Spont   LIV  1 Preterm 2004 [redacted]w[redacted]d    Vag-Spont   LIV    Medical / Surgical History: Past medical history:  Past Medical History:  Diagnosis Date  . Medical history non-contributory     Past surgical history:  Past Surgical History:  Procedure Laterality Date  . NO PAST SURGERIES     Family History:  Family History  Problem Relation Age of Onset  . Hypertension Mother   . Healthy Father     Social History:  reports that she has never smoked. She has never used smokeless tobacco. She reports previous alcohol use. She reports that she does not use drugs.  Allergies: Patient  has no known allergies.   Current Medications at time of admission:  Prior to Admission medications   Medication Sig Start Date End Date Taking? Authorizing Provider  Prenatal Vit-Fe Fumarate-FA (MULTIVITAMIN-PRENATAL) 27-0.8 MG TABS tablet Take 1 tablet by mouth daily at 12 noon.   Yes [provider]  Doxylamine-Pyridoxine 10-10 MG TBEC Take 2 tablets by mouth at bedtime. 10/07/19   Wieters, 10/09/19, PA-C  Elastic Bandages & Supports (COMFORT FIT MATERNITY SUPP SM) MISC 1 Units by Does not apply route daily as needed. 02/14/20   Nugent, 04/15/20, NP  famotidine (PEPCID) 20 MG tablet Take 1 tablet (20 mg total) by mouth daily. 11/08/19   11/10/19, CNM  glycopyrrolate (ROBINUL) 1 MG tablet Take 1 tablet (1 mg total) by mouth 3 (three) times daily. 11/08/19   11/10/19, CNM  ondansetron (ZOFRAN ODT) 4 MG disintegrating tablet Take 1 tablet (4 mg total) by mouth every 8 (eight) hours as needed for nausea or vomiting. 10/18/19   Fair, 12/16/19, MD  Prenatal MV & Min w/FA-DHA (PRENATAL ADULT GUMMY/DHA/FA) 0.4-25 MG CHEW Chew 1 each by mouth daily. 10/07/19   Wieters,  Hallie C, PA-C  promethazine (PHENERGAN) 12.5 MG tablet Take 1 tablet (12.5 mg total) by mouth every 6 (six) hours as needed for nausea or vomiting. 10/18/19   Joselyn Arrow, MD    Review of Systems: Constitutional: Negative   HENT: Negative   Eyes: Negative   Respiratory: Negative   Cardiovascular: Negative   Gastrointestinal: Negative  Genitourinary: positive for bloody show, negative for LOF   Musculoskeletal: Negative   Skin: Negative   Neurological: Negative   Endo/Heme/Allergies: Negative   Psychiatric/Behavioral: Negative    Physical Exam: VS: Weight 90.6 kg, last menstrual period 08/30/2019. AAO x3, no signs of distress Cardiovascular: RRR Respiratory: Lung fields clear to ausculation GU/GI: Abdomen gravid, non-tender, non-distended, active FM Extremities: trace edema, negative for pain,  tenderness, and cords  Cervical exam:Dilation: 3 Effacement (%): 100 Station: -1 Exam by:: jolynn FHR: baseline rate 135 / variability moderate / accelerations present / absent decelerations TOCO: 2-5   Prenatal Transfer Tool  Maternal Diabetes: No Genetic Screening: Normal Maternal Ultrasounds/Referrals: Normal Fetal Ultrasounds or other Referrals:  None Maternal Substance Abuse:  No Significant Maternal Medications:  None Significant Maternal Lab Results: Group B Strep negative    Assessment: 34 y.o. [id number] [redacted]w[redacted]d Admitted for latent labor w/ BBOW   FHR category 1 GBS neg Pain management plan: epidural   Plan:  Admit to L&D Routine admission orders Epidural PRN  Dr Sallye Ober notified of admission and plan of care  [name] MSN, CNM 05/31/2020 1:27 PM

## 2020-05-31 NOTE — Anesthesia Procedure Notes (Addendum)
Epidural Patient location during procedure: OB Start time: 05/31/2020 2:34 PM End time: 05/31/2020 2:42 PM  Staffing Anesthesiologist: Mal Amabile, MD Performed: anesthesiologist   Preanesthetic Checklist Completed: patient identified, IV checked, site marked, risks and benefits discussed, surgical consent, monitors and equipment checked, pre-op evaluation and timeout performed  Epidural Patient position: sitting Prep: DuraPrep and site prepped and draped Patient monitoring: continuous pulse ox and blood pressure Approach: midline Location: L3-L4 Injection technique: LOR air  Needle:  Needle type: Tuohy  Needle gauge: 17 G Needle length: 9 cm and 9 Needle insertion depth: 5 cm cm Catheter type: closed end flexible Catheter size: 19 Gauge Catheter at skin depth: 10 cm Test dose: negative and Other  Assessment Events: blood not aspirated, injection not painful, no injection resistance, no paresthesia and negative IV test  Additional Notes Patient identified. Risks and benefits discussed including failed block, incomplete  Pain control, post dural puncture headache, nerve damage, paralysis, blood pressure Changes, nausea, vomiting, reactions to medications-both toxic and allergic and post Partum back pain. All questions were answered. Patient expressed understanding and wished to proceed. Sterile technique was used throughout procedure. Epidural site was Dressed with sterile barrier dressing. No paresthesias, signs of intravascular injection Or signs of intrathecal spread were encountered.  Patient was more comfortable after the epidural was dosed. Please see RN's note for documentation of vital signs and FHR which are stable. Reason for block:procedure for pain

## 2020-05-31 NOTE — Anesthesia Preprocedure Evaluation (Signed)
Anesthesia Evaluation  Patient identified by MRN, date of birth, ID band Patient awake    Reviewed: Allergy & Precautions, H&P , Patient's Chart, lab work & pertinent test results  Airway Mallampati: II  TM Distance: >3 FB Neck ROM: full    Dental no notable dental hx. (+) Teeth Intact   Pulmonary neg pulmonary ROS,    Pulmonary exam normal breath sounds clear to auscultation       Cardiovascular negative cardio ROS Normal cardiovascular exam Rhythm:regular Rate:Normal     Neuro/Psych negative neurological ROS  negative psych ROS   GI/Hepatic negative GI ROS, Neg liver ROS,   Endo/Other  negative endocrine ROS  Renal/GU negative Renal ROS  negative genitourinary   Musculoskeletal negative musculoskeletal ROS (+)   Abdominal   Peds  Hematology negative hematology ROS (+)   Anesthesia Other Findings   Reproductive/Obstetrics (+) Pregnancy                             Anesthesia Physical Anesthesia Plan  ASA: II  Anesthesia Plan: Epidural   Post-op Pain Management:    Induction:   PONV Risk Score and Plan:   Airway Management Planned: Natural Airway  Additional Equipment:   Intra-op Plan:   Post-operative Plan:   Informed Consent: I have reviewed the patients History and Physical, chart, labs and discussed the procedure including the risks, benefits and alternatives for the proposed anesthesia with the patient or authorized representative who has indicated his/her understanding and acceptance.       Plan Discussed with: Anesthesiologist  Anesthesia Plan Comments:         Anesthesia Quick Evaluation

## 2020-06-01 LAB — CBC
HCT: 33 % — ABNORMAL LOW (ref 36.0–46.0)
Hemoglobin: 10.9 g/dL — ABNORMAL LOW (ref 12.0–15.0)
MCH: 29.1 pg (ref 26.0–34.0)
MCHC: 33 g/dL (ref 30.0–36.0)
MCV: 88 fL (ref 80.0–100.0)
Platelets: 196 10*3/uL (ref 150–400)
RBC: 3.75 MIL/uL — ABNORMAL LOW (ref 3.87–5.11)
RDW: 14 % (ref 11.5–15.5)
WBC: 12 10*3/uL — ABNORMAL HIGH (ref 4.0–10.5)
nRBC: 0 % (ref 0.0–0.2)

## 2020-06-01 LAB — RPR: RPR Ser Ql: NONREACTIVE

## 2020-06-01 MED ORDER — FAMOTIDINE 20 MG PO TABS
40.0000 mg | ORAL_TABLET | Freq: Once | ORAL | Status: AC
  Administered 2020-06-02: 40 mg via ORAL
  Filled 2020-06-01 (×2): qty 2

## 2020-06-01 MED ORDER — LACTATED RINGERS IV SOLN: INTRAVENOUS | Status: DC

## 2020-06-01 MED ORDER — METOCLOPRAMIDE HCL 10 MG PO TABS
10.0000 mg | ORAL_TABLET | Freq: Once | ORAL | Status: AC
  Administered 2020-06-02: 10 mg via ORAL
  Filled 2020-06-01: qty 1

## 2020-06-01 NOTE — Clinical Social Work Maternal (Signed)
CLINICAL SOCIAL WORK MATERNAL/CHILD NOTE  Patient Details  Name: Kayla Brandt MRN: [id number] Date of Birth: 07/31/1986  Date:  06/01/2020  Clinical Social Worker Initiating Note:  [place], LCSWA Date/Time: Initiated:  06/01/20/1045     Child's Name:  Kayla Brandt   Biological Parents:  Mother, Father   Need for Interpreter:  None   Reason for Referral:  Current Substance Use/Substance Use During Pregnancy    Address:  [address] [place] [address]    Phone number:  [phone] (home)     Additional phone number:   Household Members/Support Persons (HM/SP):   Household Member/Support Person 1, Household Member/Support Person 2, Household Member/Support Person 3, Household Member/Support Person 4, Household Member/Support Person 5   HM/SP Name Relationship DOB or Age  HM/SP -1 Grayling Easterling Boyfriend    HM/SP -2 Jaylen Kozar Son 07/19/2003  HM/SP -3 Janiyah Sparrow Daughter 08/30/2006  HM/SP -4 Jordyn Miyasato Son 10/11/2007  HM/SP -5 Journee Phifer Daughter 11/29/2010  HM/SP -6        HM/SP -7        HM/SP -8          Natural Supports (not living in the home):  Immediate Family   Professional Supports:     Employment: Full-time   Type of Work: [employer]   Education:  High school graduate   Homebound arranged:    Financial Resources:  Medicaid   Other Resources:  Food Stamps , WIC   Cultural/Religious Considerations Which May Impact Care:  None  Strengths:  Ability to meet basic needs , Pediatrician chosen, Home prepared for child    Psychotropic Medications:         Pediatrician:    [place] area  Pediatrician List:   [place], [place]  [place]    [place]    [place]    [place]    [place]      Pediatrician Fax Number:    Risk Factors/Current Problems:  Substance Use    Cognitive State:  Alert    Mood/Affect:  Calm , Interested ,  Relaxed    CSW Assessment: CSW consulted for substance use during pregnancy. MOB positive for THC June 2021. CSW met with MOB alone to provide support and complete assessment. CSW congratulated MOB and asked how she has been feeling since giving birth. MOB expressed she has been feeling good. MOB declined having any mental health history or having ever been on any medication for mental health. CSW asked MOB if she has experienced any SI, or HI since giving birth, MOB expressed she has not. MOB denied being involved in any DV.  CSW provided education regarding the baby blues period vs. perinatal mood disorders, discussed treatment and gave resources for mental health follow up if concerns arise.  CSW recommends self-evaluation during the postpartum time period using the New Mom Checklist from Postpartum Progress and encouraged MOB to contact a medical professional if symptoms are noted at any time.    CSW provided review of Sudden Infant Death Syndrome (SIDS) precautions. MOB expressed she has all the essential items needed to care for baby, including a brand new carseat. MOB declined having any transportation barriers for follow-up care. CSW asked MOB if she would like any additional resources or referrals for baby, MOB declined.    CSW asked MOB if she has had any substance use during pregnancy, MOB stated no. CSW informed MOB that prenatal records show she had an UDS   that was positive for THC in June. MOB expressed understanding. CSW asked MOB when she last used substances, she stated in June. CSW asked MOB if she has used any other substances or prescription drugs during pregancy, MOB stated she has not. CSW informed MOB of hospital drug screen policy, explaining baby will have an UDS and CDS completed. CSW explained if either result positive for substances, a CPS report will be made. CSW asked MOB if she has ever had any CPS history, she stated no. MOB expressed understanding and denied having any  additional questions regarding drug screen policy.   CSW will continue to follow UDS and CDS. CSW will make a CPS report as needed.   MOB expressed she has no additional questions or concerns. CSW identifies no further need for intervention and no barriers to discharge at this time.   CSW Plan/Description:  No Further Intervention Required/No Barriers to Discharge, Sudden Infant Death Syndrome (SIDS) Education, Perinatal Mood and Anxiety Disorder (PMADs) Education, Child Protective Service Report , CSW Will Continue to Monitor Umbilical Cord Tissue Drug Screen Results and Make Report if Warranted, Hospital Drug Screen Policy Information    Denzel Etienne J Shameeka Silliman, LCSWA 06/01/2020, 11:19 AM 

## 2020-06-01 NOTE — Anesthesia Postprocedure Evaluation (Signed)
Anesthesia Post Note  Patient: Kayla Brandt  Procedure(s) Performed: AN AD HOC LABOR EPIDURAL     Patient location during evaluation: Mother Baby Anesthesia Type: Epidural Level of consciousness: awake and alert and oriented Pain management: satisfactory to patient Vital Signs Assessment: post-procedure vital signs reviewed and stable Respiratory status: respiratory function stable Cardiovascular status: stable Postop Assessment: no headache, no backache, epidural receding, patient able to bend at knees, no signs of nausea or vomiting, adequate PO intake and able to ambulate Anesthetic complications: no   No complications documented.  Last Vitals:  Vitals:   06/01/20 0135 06/01/20 0516  BP: (!) 94/52 96/69  Pulse: 98 81  Resp: 16 16  Temp: 37.2 C 36.8 C  SpO2:      Last Pain:  Vitals:   06/01/20 0516  TempSrc: Oral  PainSc: 7    Pain Goal:                   Glema Takaki

## 2020-06-02 ENCOUNTER — Inpatient Hospital Stay (HOSPITAL_COMMUNITY): Payer: Medicaid Other | Admitting: Anesthesiology

## 2020-06-02 ENCOUNTER — Encounter (HOSPITAL_COMMUNITY)
Admission: AD | Disposition: A | Discharge status: Home / Self Care | Payer: Self-pay | Source: Home / Self Care | Attending: Obstetrics & Gynecology

## 2020-06-02 ENCOUNTER — Encounter (HOSPITAL_COMMUNITY): Payer: Self-pay | Admitting: Obstetrics and Gynecology

## 2020-06-02 HISTORY — PX: TUBAL LIGATION: SHX77

## 2020-06-02 SURGERY — LIGATION, FALLOPIAN TUBE, POSTPARTUM
Anesthesia: Epidural | Wound class: Clean Contaminated

## 2020-06-02 MED ORDER — OXYCODONE HCL 5 MG PO TABS
10.0000 mg | ORAL_TABLET | ORAL | Status: DC | PRN
  Administered 2020-06-02: 5 mg via ORAL

## 2020-06-02 MED ORDER — ONDANSETRON HCL 4 MG/2ML IJ SOLN
INTRAMUSCULAR | Status: AC
  Filled 2020-06-02: qty 2

## 2020-06-02 MED ORDER — BUPIVACAINE HCL (PF) 0.25 % IJ SOLN
INTRAMUSCULAR | Status: AC
  Filled 2020-06-02: qty 30

## 2020-06-02 MED ORDER — BUPIVACAINE HCL (PF) 0.25 % IJ SOLN
INTRAMUSCULAR | Status: DC | PRN
  Administered 2020-06-02: 10 mL

## 2020-06-02 MED ORDER — LACTATED RINGERS IV SOLN: INTRAVENOUS | Status: DC | PRN

## 2020-06-02 MED ORDER — OXYCODONE HCL 5 MG PO TABS: 5.0000 mg | ORAL_TABLET | Freq: Once | ORAL | Status: DC | PRN

## 2020-06-02 MED ORDER — OXYCODONE HCL 5 MG PO TABS
5.0000 mg | ORAL_TABLET | Freq: Four times a day (QID) | ORAL | Status: DC | PRN
  Administered 2020-06-02: 5 mg via ORAL
  Filled 2020-06-02 (×2): qty 1

## 2020-06-02 MED ORDER — MIDAZOLAM HCL 2 MG/2ML IJ SOLN
INTRAMUSCULAR | Status: AC
  Filled 2020-06-02: qty 2

## 2020-06-02 MED ORDER — SODIUM BICARBONATE 8.4 % IV SOLN
INTRAVENOUS | Status: DC | PRN
  Administered 2020-06-02: 3 mL via EPIDURAL

## 2020-06-02 MED ORDER — HYDROMORPHONE HCL 1 MG/ML IJ SOLN
INTRAMUSCULAR | Status: AC
  Filled 2020-06-02: qty 0.5

## 2020-06-02 MED ORDER — FENTANYL CITRATE (PF) 100 MCG/2ML IJ SOLN
INTRAMUSCULAR | Status: AC
  Filled 2020-06-02: qty 2

## 2020-06-02 MED ORDER — PROMETHAZINE HCL 25 MG/ML IJ SOLN: 6.2500 mg | INTRAMUSCULAR | Status: DC | PRN

## 2020-06-02 MED ORDER — OXYCODONE HCL 5 MG/5ML PO SOLN: 5.0000 mg | Freq: Once | ORAL | Status: DC | PRN

## 2020-06-02 MED ORDER — MIDAZOLAM HCL 5 MG/5ML IJ SOLN
INTRAMUSCULAR | Status: DC | PRN
  Administered 2020-06-02 (×2): .5 mg via INTRAVENOUS
  Administered 2020-06-02 (×2): 1 mg via INTRAVENOUS

## 2020-06-02 MED ORDER — ONDANSETRON HCL 4 MG/2ML IJ SOLN
INTRAMUSCULAR | Status: DC | PRN
  Administered 2020-06-02: 4 mg via INTRAVENOUS

## 2020-06-02 MED ORDER — PROPOFOL 10 MG/ML IV BOLUS
INTRAVENOUS | Status: AC
  Filled 2020-06-02: qty 20

## 2020-06-02 MED ORDER — OXYCODONE HCL 5 MG PO TABS
5.0000 mg | ORAL_TABLET | ORAL | Status: DC | PRN
  Administered 2020-06-02: 5 mg via ORAL
  Filled 2020-06-02: qty 1

## 2020-06-02 MED ORDER — HYDROMORPHONE HCL 1 MG/ML IJ SOLN
0.2500 mg | INTRAMUSCULAR | Status: DC | PRN
  Administered 2020-06-02: 0.5 mg via INTRAVENOUS

## 2020-06-02 MED ORDER — KETOROLAC TROMETHAMINE 30 MG/ML IJ SOLN
30.0000 mg | Freq: Four times a day (QID) | INTRAMUSCULAR | Status: DC
  Administered 2020-06-03 (×3): 30 mg via INTRAVENOUS
  Filled 2020-06-02 (×4): qty 1

## 2020-06-02 MED ORDER — PROPOFOL 10 MG/ML IV BOLUS
INTRAVENOUS | Status: DC | PRN
  Administered 2020-06-02 (×3): 10 mg via INTRAVENOUS
  Administered 2020-06-02: 20 mg via INTRAVENOUS
  Administered 2020-06-02: 10 mg via INTRAVENOUS

## 2020-06-02 MED ORDER — FENTANYL CITRATE (PF) 100 MCG/2ML IJ SOLN
INTRAMUSCULAR | Status: DC | PRN
Start: 2020-06-02 — End: 2020-06-02
  Administered 2020-06-02 (×2): 50 ug via INTRAVENOUS

## 2020-06-02 MED ORDER — BUPIVACAINE IN DEXTROSE 0.75-8.25 % IT SOLN
INTRATHECAL | Status: DC | PRN
  Administered 2020-06-02: 1.6 mL via INTRATHECAL

## 2020-06-02 MED ORDER — ZOLPIDEM TARTRATE 5 MG PO TABS
5.0000 mg | ORAL_TABLET | Freq: Once | ORAL | Status: AC
  Administered 2020-06-02: 5 mg via ORAL
  Filled 2020-06-02: qty 1

## 2020-06-02 SURGICAL SUPPLY — 29 items
ADH SKN CLS APL DERMABOND .7 (GAUZE/BANDAGES/DRESSINGS) ×1
CLOTH BEACON ORANGE TIMEOUT ST (SAFETY) ×3 IMPLANT
DERMABOND ADVANCED (GAUZE/BANDAGES/DRESSINGS) ×2
DERMABOND ADVANCED .7 DNX12 (GAUZE/BANDAGES/DRESSINGS) ×1 IMPLANT
DRSG OPSITE POSTOP 3X4 (GAUZE/BANDAGES/DRESSINGS) ×3 IMPLANT
DRSG OPSITE POSTOP 4X14 (GAUZE/BANDAGES/DRESSINGS) ×2 IMPLANT
DURAPREP 26ML APPLICATOR (WOUND CARE) ×3 IMPLANT
ELECT REM PT RETURN 9FT ADLT (ELECTROSURGICAL) ×3
ELECTRODE REM PT RTRN 9FT ADLT (ELECTROSURGICAL) ×1 IMPLANT
GLOVE BIO SURGEON STRL SZ7.5 (GLOVE) ×3 IMPLANT
GLOVE BIOGEL PI IND STRL 7.0 (GLOVE) ×1 IMPLANT
GLOVE BIOGEL PI IND STRL 7.5 (GLOVE) ×1 IMPLANT
GLOVE BIOGEL PI INDICATOR 7.0 (GLOVE) ×2
GLOVE BIOGEL PI INDICATOR 7.5 (GLOVE) ×2
GOWN STRL REUS W/TWL LRG LVL3 (GOWN DISPOSABLE) ×6 IMPLANT
NEEDLE HYPO 22GX1.5 SAFETY (NEEDLE) ×3 IMPLANT
NS IRRIG 1000ML POUR BTL (IV SOLUTION) ×3 IMPLANT
PACK ABDOMINAL MINOR (CUSTOM PROCEDURE TRAY) ×3 IMPLANT
PENCIL BUTTON HOLSTER BLD 10FT (ELECTRODE) ×3 IMPLANT
PROTECTOR NERVE ULNAR (MISCELLANEOUS) ×3 IMPLANT
SPONGE LAP 4X18 RFD (DISPOSABLE) IMPLANT
SUT MNCRL AB 3-0 PS2 27 (SUTURE) ×3 IMPLANT
SUT PLAIN 0 NONE (SUTURE) IMPLANT
SUT VIC AB 0 CT1 36 (SUTURE) ×3 IMPLANT
SUT VIC AB 2-0 SH 27 (SUTURE) ×9
SUT VIC AB 2-0 SH 27XBRD (SUTURE) ×2 IMPLANT
SYR CONTROL 10ML LL (SYRINGE) ×3 IMPLANT
TOWEL OR 17X24 6PK STRL BLUE (TOWEL DISPOSABLE) ×6 IMPLANT
TRAY FOLEY CATH SILVER 14FR (SET/KITS/TRAYS/PACK) ×3 IMPLANT

## 2020-06-02 NOTE — Op Note (Signed)
Indication: r/b/a discussed with patient in hallway/holding area before proceeding back to the c-section suite.  I discussed sterilization via bilateral salpingectomy as well.  Pt desired tubes to be removed.  Pt had no questions and was ready to proceed.  Preop Diagnosis: Desires sterilization   Postop Diagnosis: Desires sterilzation   Procedure: PP sterilization/BS   Anesthesia: Spinal  Anesthesiologist: Lowella Curb, MD   Attending: Osborn Coho, MD   Assistant: Scrub Tech  Findings: Nl appearing tubes and left ovary  Pathology: Bilateral tubes  Fluids: 1500 cc  UOP: Voided prior to procedure  EBL: 10 cc  Complications: None  Procedure:The patient was taken to the operating room after the risks, benefits, alternative, complications, treatment options, and expected outcomes were discussed with the patient. The patient verbalized understanding, the patient concurred with the proposed plan and consent signed and witnessed. The patient was taken to the Operating Room, identified as Kayla Brandt and the procedure verified as bilateral tubal ligation possible bilateral . A Time Out was held and the above information confirmed.  The patient was given a surgical level via the epidural and prepped, draped, and catheterized in the normal, sterile fashion in the supine position.  A 9mm incision was made at the umbilicus and carried down to the underlying layer of fascia and peritoneum entered without difficulty.  The left fallopian tube was indentified and carried out to its fimbriated end and Kelly clamped beneath tube after elevating with babcock.  The tube was cut and suture ligated with 2-0 vicryl doubly and ligated as well. The tube was sent to pathology.  There was some oozing between mesosalpinx and ovary.  This area was clamped with a kelly and suture ligated with 2-0 vicryl for hemostasis.  The pedicle was then cauterized with the bovie.  The same was done on the  contralateral side, no oozing noted on this side and pedicle cauterized as well.    The fascia was then repaired with 0 vicryl via a running stitch and the incision was closed with 3-0 vicryl via subcuticular sutures and dermabond applied.  Instrument, sponge, and needle counts were correct, patient tolerated the procedure well and was returned to the recovery room in good condition.

## 2020-06-02 NOTE — Anesthesia Preprocedure Evaluation (Signed)
Anesthesia Evaluation  Patient identified by MRN, date of birth, ID band Patient awake    Reviewed: Allergy & Precautions, H&P , NPO status , Patient's Chart, lab work & pertinent test results  Airway Mallampati: II  TM Distance: >3 FB Neck ROM: full    Dental no notable dental hx. (+) Teeth Intact   Pulmonary neg pulmonary ROS,    Pulmonary exam normal breath sounds clear to auscultation       Cardiovascular negative cardio ROS Normal cardiovascular exam Rhythm:regular Rate:Normal     Neuro/Psych negative neurological ROS  negative psych ROS   GI/Hepatic negative GI ROS, Neg liver ROS,   Endo/Other  negative endocrine ROS  Renal/GU negative Renal ROS  negative genitourinary   Musculoskeletal negative musculoskeletal ROS (+)   Abdominal (+) + obese,   Peds negative pediatric ROS (+)  Hematology negative hematology ROS (+)   Anesthesia Other Findings   Reproductive/Obstetrics negative OB ROS                             Anesthesia Physical  Anesthesia Plan  ASA: II  Anesthesia Plan: Epidural   Post-op Pain Management:    Induction: Intravenous  PONV Risk Score and Plan: 2 and Ondansetron, Midazolam and Treatment may vary due to age or medical condition  Airway Management Planned: Natural Airway  Additional Equipment:   Intra-op Plan:   Post-operative Plan:   Informed Consent: I have reviewed the patients History and Physical, chart, labs and discussed the procedure including the risks, benefits and alternatives for the proposed anesthesia with the patient or authorized representative who has indicated his/her understanding and acceptance.     Dental advisory given  Plan Discussed with: Anesthesiologist  Anesthesia Plan Comments:         Anesthesia Quick Evaluation

## 2020-06-02 NOTE — Anesthesia Procedure Notes (Signed)
Spinal  Patient location during procedure: OB End time: 06/02/2020 10:34 AM Staffing Performed: anesthesiologist  Anesthesiologist: Lowella Curb, MD Preanesthetic Checklist Completed: patient identified, IV checked, risks and benefits discussed, surgical consent, monitors and equipment checked, pre-op evaluation and timeout performed Spinal Block Patient position: sitting Prep: DuraPrep and site prepped and draped Patient monitoring: heart rate, cardiac monitor, continuous pulse ox and blood pressure Approach: midline Location: L3-4 Injection technique: single-shot Needle Needle type: Pencan  Needle gauge: 24 G Needle length: 10 cm Assessment Sensory level: T4

## 2020-06-02 NOTE — Progress Notes (Signed)
Subjective: Postpartum Day # 1 : S/P NSVD due to normal spontaneous labor. Patient up ad lib, denies syncope or dizziness. Reports consuming regular diet without issues and denies N/V. Patient reports + bowel movement 0 passing flatus.  Denies issues with urination and reports bleeding is "lighter."  Patient is bottlefeeding and reports going well.  Receiving BTL today with Dr Su Hilt for postpartum contraception.  Pain is being appropriately managed with use of po meds. NPO since 6am.   No laceration Feeding:  Breast Contraceptive plan:  BTL today Baby Female  Objective: Vital signs in last 24 hours: Patient Vitals for the past 24 hrs:  BP Temp Temp src Pulse Resp SpO2  06/02/20 0737 (!) 109/93 98 F (36.7 C) Oral 69 18 100 %  06/01/20 2155 97/77 98.8 F (37.1 C) Oral 70 18 --  06/01/20 0955 112/87 98.5 F (36.9 C) Oral 82 17 100 %     Physical Exam:  General: alert, cooperative, appears stated age and no distress Mood/Affect: Happy Lungs: clear to auscultation, no wheezes, rales or rhonchi, symmetric air entry.  Heart: normal rate, regular rhythm, normal S1, S2, no murmurs, rubs, clicks or gallops. Breast: breasts appear normal, no suspicious masses, no skin or nipple changes or axillary nodes. Abdomen:  + bowel sounds, soft, non-tender GU: perineum intact, healing well. No signs of external hematomas.  Uterine Fundus: firm Lochia: appropriate Skin: Warm, Dry. DVT Evaluation: No evidence of DVT seen on physical exam. Negative Homan's sign. No cords or calf tenderness. No significant calf/ankle edema.  CBC Latest Ref Rng & Units 06/01/2020 05/31/2020 02/14/2020  WBC 4.0 - 10.5 K/uL 12.0(H) 10.6(H) 10.1  Hemoglobin 12.0 - 15.0 g/dL 10.9(L) 12.9 11.8(L)  Hematocrit 36 - 46 % 33.0(L) 39.9 36.2  Platelets 150 - 400 K/uL 196 254 254    No results found for this or any previous visit (from the past 24 hour(s)).   CBG (last 3)  No results for input(s): GLUCAP in the last 72 hours.    No intake/output data recorded.   Assessment Postpartum Day # 1 : S/P NSVD due to spontaneous labor. Pt stable. -1 involution. bottlefeeding. Hemodynamically stable.   Plan: Continue other mgmt as ordered VTE prophylactics: Early ambulated as tolerates.  Pain control: Motrin/Tylenol PRN Plan for discharge tomorrow   Dr. Su Hilt to be updated on patient status  [facility] NP-C, CNM 06/02/2020, 9:24 AM

## 2020-06-02 NOTE — Transfer of Care (Signed)
Immediate Anesthesia Transfer of Care Note  Patient: Kayla Brandt  Procedure(s) Performed: POST PARTUM TUBAL LIGATION (N/A )  Patient Location: PACU  Anesthesia Type:Spinal  Level of Consciousness: awake, alert  and oriented  Airway & Oxygen Therapy: Patient Spontanous Breathing  Post-op Assessment: Report given to RN and Post -op Vital signs reviewed and stable  Post vital signs: Reviewed and stable  Last Vitals:  Vitals Value Taken Time  BP 98/75 06/02/20 1147  Temp 36.6 C 06/02/20 1147  Pulse 71 06/02/20 1151  Resp 12 06/02/20 1151  SpO2 100 % 06/02/20 1151  Vitals shown include unvalidated device data.  Last Pain:  Vitals:   06/02/20 1147  TempSrc: Oral  PainSc:          Complications: No complications documented.

## 2020-06-02 NOTE — Anesthesia Postprocedure Evaluation (Signed)
Anesthesia Post Note  Patient: Kayla Brandt  Procedure(s) Performed: POST PARTUM TUBAL LIGATION (N/A )     Patient location during evaluation: PACU Anesthesia Type: Spinal Level of consciousness: awake and alert Pain management: pain level controlled Vital Signs Assessment: post-procedure vital signs reviewed and stable Respiratory status: spontaneous breathing, nonlabored ventilation and respiratory function stable Cardiovascular status: blood pressure returned to baseline and stable Postop Assessment: no apparent nausea or vomiting Anesthetic complications: no   No complications documented.  Last Vitals:  Vitals:   06/02/20 1320 06/02/20 1330  BP:  124/86  Pulse: 73 72  Resp: 13 13  Temp:  36.9 C  SpO2: 100% 98%    Last Pain:  Vitals:   06/02/20 1330  TempSrc: Oral  PainSc:    Pain Goal: Patients Stated Pain Goal: 2 (06/02/20 1313)              Epidural/Spinal Function Cutaneous sensation: Tingles (06/02/20 1315), Patient able to flex knees: Yes (06/02/20 1315), Patient able to lift hips off bed: No (06/02/20 1315), Back pain beyond tenderness at insertion site: No (06/02/20 1315), Progressively worsening motor and/or sensory loss: No (06/02/20 1315), Bowel and/or bladder incontinence post epidural: No (06/02/20 1315)  Lowella Curb

## 2020-06-03 LAB — CBC
HCT: 33.8 % — ABNORMAL LOW (ref 36.0–46.0)
Hemoglobin: 11 g/dL — ABNORMAL LOW (ref 12.0–15.0)
MCH: 28.9 pg (ref 26.0–34.0)
MCHC: 32.5 g/dL (ref 30.0–36.0)
MCV: 88.7 fL (ref 80.0–100.0)
Platelets: 187 10*3/uL (ref 150–400)
RBC: 3.81 MIL/uL — ABNORMAL LOW (ref 3.87–5.11)
RDW: 13.9 % (ref 11.5–15.5)
WBC: 9 10*3/uL (ref 4.0–10.5)
nRBC: 0 % (ref 0.0–0.2)

## 2020-06-03 MED ORDER — ACETAMINOPHEN 325 MG PO TABS: 650.0000 mg | ORAL_TABLET | ORAL | Status: AC | PRN

## 2020-06-03 MED ORDER — OXYCODONE HCL 10 MG PO TABS
5.0000 mg | ORAL_TABLET | Freq: Four times a day (QID) | ORAL | 0 refills | Status: AC | PRN
Start: 2020-06-03 — End: 2020-06-06

## 2020-06-03 MED ORDER — IBUPROFEN 600 MG PO TABS: 600.0000 mg | ORAL_TABLET | Freq: Four times a day (QID) | ORAL | 0 refills | Status: AC

## 2020-06-03 NOTE — Discharge Summary (Signed)
Postpartum Discharge Summary  Date of Service updated 06/03/20    Patient Name: Kayla Brandt DOB: [birth year] MRN: [id number]  Date of admission: 05/31/2020 Delivery date:05/31/2020  Delivering provider: Burman Foster B  Date of discharge: 06/03/2020  Admitting diagnosis: Normal labor [O80, Z37.9] Intrauterine pregnancy: [redacted]w[redacted]d    Secondary diagnosis:  Principal Problem:   Postpartum care following vaginal delivery 9/17 Active Problems:   SVD (9/16)   Normal labor  Additional problems: none    Discharge diagnosis: Term Pregnancy Delivered and Permanent sterilization                                              Post partum procedures:postpartum tubal ligation Augmentation: AROM Complications: None  Hospital course: Onset of Labor With Vaginal Delivery      34y.o. yo [id number] 32w2das admitted in Latent Labor on 05/31/2020. Patient had an uncomplicated labor course as follows:  Membrane Rupture Time/Date: 4:30 PM ,05/31/2020   Delivery Method:Vaginal, Spontaneous  Episiotomy: None  Lacerations:  None  Patient had an uncomplicated postpartum course.  She is ambulating, tolerating a regular diet, passing flatus, and urinating well. Patient is discharged home in stable condition on 06/03/20.  Newborn Data: Birth date:05/31/2020  Birth time:6:47 PM  Gender:Female  Living status:Living  Apgars:9 ,9  Weight:2909 g   Magnesium Sulfate received: No BMZ received: No Rhophylac:N/A MMR:N/A T-DaP:declined Flu: No Transfusion:No  Physical exam  Vitals:   06/02/20 1723 06/02/20 1952 06/03/20 0525 06/03/20 1017  BP: 99/80 124/85 112/82 111/81  Pulse: 97 62 (!) 58 75  Resp: 16 18 18 18   Temp: 98.8 F (37.1 C) 98.6 F (37 C) 98.4 F (36.9 C) 98.5 F (36.9 C)  TempSrc: Oral Oral Oral Oral  SpO2: 100% 100%  100%  Weight:      Height:       General: alert, cooperative and no distress Lochia: appropriate Uterine Fundus: firm Incision: Healing well with no significant  drainage, Dressing is clean, dry, and intact DVT Evaluation: No evidence of DVT seen on physical exam. No cords or calf tenderness. No significant calf/ankle edema. Labs: Lab Results  Component Value Date   WBC 9.0 06/03/2020   HGB 11.0 (L) 06/03/2020   HCT 33.8 (L) 06/03/2020   MCV 88.7 06/03/2020   PLT 187 06/03/2020   CMP Latest Ref Rng & Units 02/14/2020  Glucose 70 - 99 mg/dL 90  BUN 6 - 20 mg/dL 14  Creatinine 0.44 - 1.00 mg/dL 0.50  Sodium 135 - 145 mmol/L 135  Potassium 3.5 - 5.1 mmol/L 3.6  Chloride 98 - 111 mmol/L 103  CO2 22 - 32 mmol/L 23  Calcium 8.9 - 10.3 mg/dL 8.6(L)  Total Protein 6.5 - 8.1 g/dL 5.8(L)  Total Bilirubin 0.3 - 1.2 mg/dL 0.2(L)  Alkaline Phos 38 - 126 U/L 59  AST 15 - 41 U/L 15  ALT 0 - 44 U/L 11   Edinburgh Score: Edinburgh Postnatal Depression Scale Screening Tool 06/03/2020  I have been able to laugh and see the funny side of things. 0  I have looked forward with enjoyment to things. 0  I have blamed myself unnecessarily when things went wrong. 0  I have been anxious or worried for no good reason. 0  I have felt scared or panicky for no good reason. 0  Things have been getting on top  of me. 0  I have been so unhappy that I have had difficulty sleeping. 0  I have felt sad or miserable. 0  I have been so unhappy that I have been crying. 0  The thought of harming myself has occurred to me. 0  Edinburgh Postnatal Depression Scale Total 0      After visit meds:  Allergies as of 06/03/2020   No Known Allergies     Medication List    STOP taking these medications   Comfort Fit Maternity Supp Sm Misc   Doxylamine-Pyridoxine 10-10 MG Tbec   famotidine 20 MG tablet Commonly known as: PEPCID   glycopyrrolate 1 MG tablet Commonly known as: Robinul   ondansetron 4 MG disintegrating tablet Commonly known as: Zofran ODT   Prenatal Adult Gummy/DHA/FA 0.4-25 MG Chew   promethazine 12.5 MG tablet Commonly known as: PHENERGAN     TAKE  these medications   acetaminophen 325 MG tablet Commonly known as: Tylenol Take 2 tablets (650 mg total) by mouth every 4 (four) hours as needed (for pain scale < 4).   ibuprofen 600 MG tablet Commonly known as: ADVIL Take 1 tablet (600 mg total) by mouth every 6 (six) hours.   multivitamin-prenatal 27-0.8 MG Tabs tablet Take 1 tablet by mouth daily at 12 noon.   Oxycodone HCl 10 MG Tabs Take 0.5 tablets (5 mg total) by mouth every 6 (six) hours as needed for up to 3 days for severe pain or breakthrough pain.        Discharge home in stable condition Infant Feeding: Bottle Infant Disposition:home with mother Discharge instruction: per After Visit Summary and Postpartum booklet. Activity: Advance as tolerated. Pelvic rest for 6 weeks.  Diet: low salt diet Anticipated Birth Control: BTL done PP Postpartum Appointment:6 weeks Additional Postpartum F/U: none Future Appointments:No future appointments. Follow up Visit:  [facility]. Schedule an appointment as soon as possible for a visit in 6 week(s).   Specialty: Obstetrics and Gynecology Contact information: [address] [place] [address] [phone]                  06/03/2020 Arrie Eastern, CNM

## 2020-06-05 LAB — SURGICAL PATHOLOGY

## 2020-06-27 IMAGING — US US OB COMP LESS 14 WK
1 series · 15 of 28 positions shown · non-contrast
Comparison: None.

CLINICAL DATA: Pelvic pain for 2 days, positive urine pregnancy
test, gravida 5 para 4 abortion 0

EXAM:
OBSTETRIC <14 WK ULTRASOUND
TECHNIQUE: Transabdominal ultrasound was performed for evaluation of the
gestation as well as the maternal uterus and adnexal regions.

[Series 1: us ob comp less 14 wk · 15 of 33 slices shown]
[im 1/33]
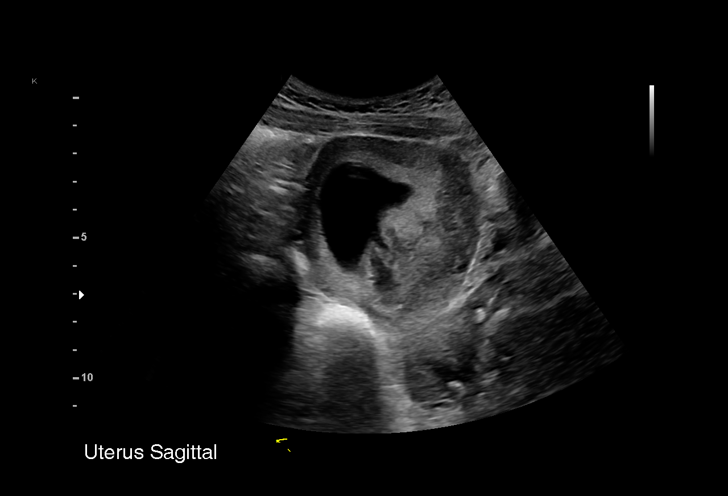
[im 3/33]
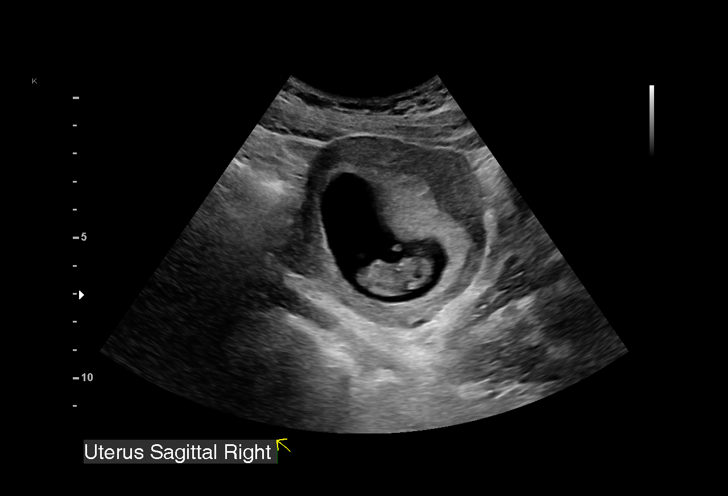
[im 5/33]
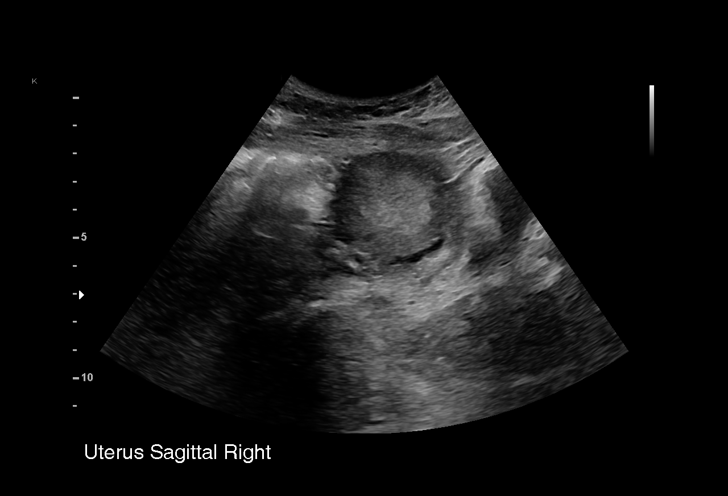
[im 8/33]
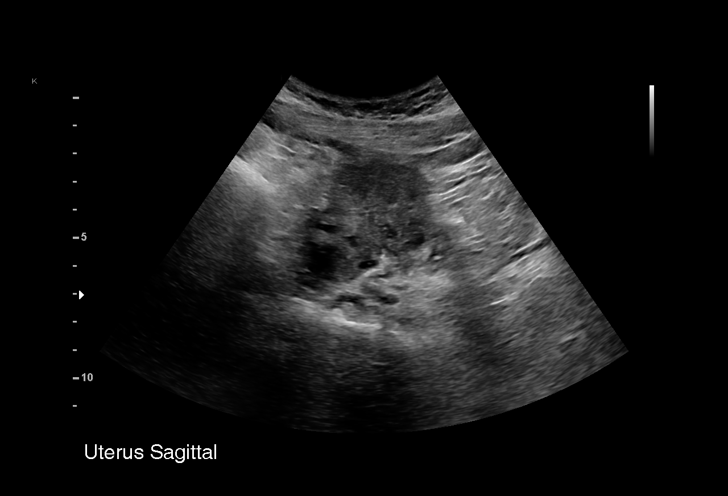
[im 10/33]
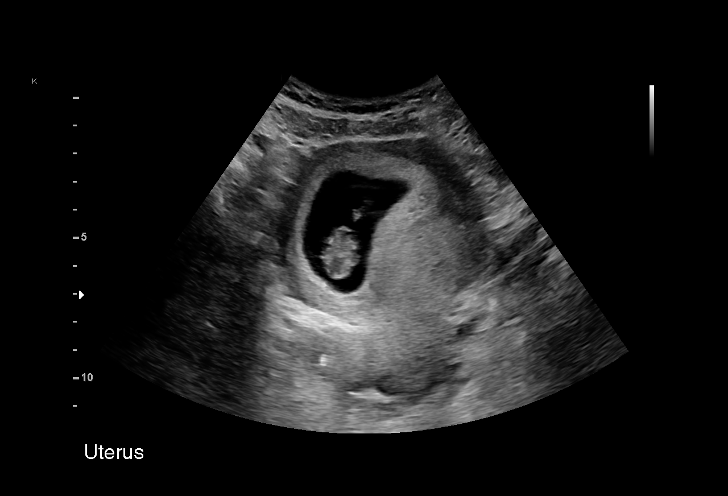
[im 12/33]
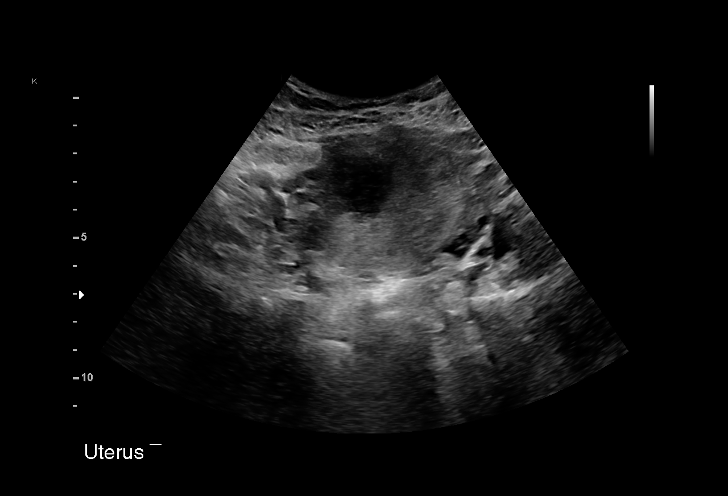
[im 15/33]
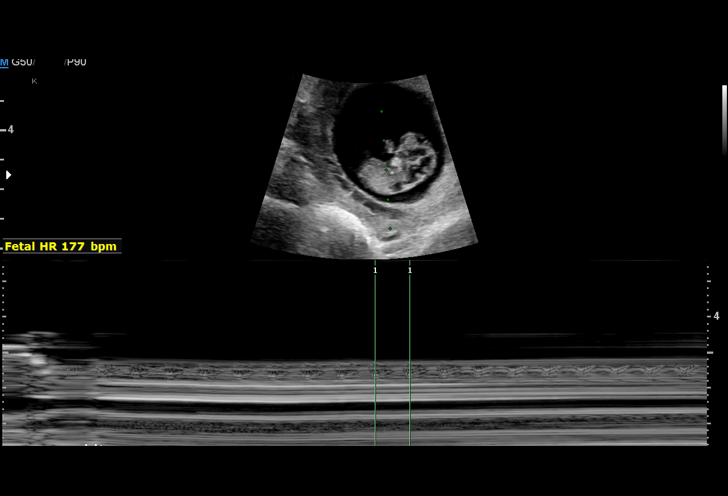
[im 17/33]
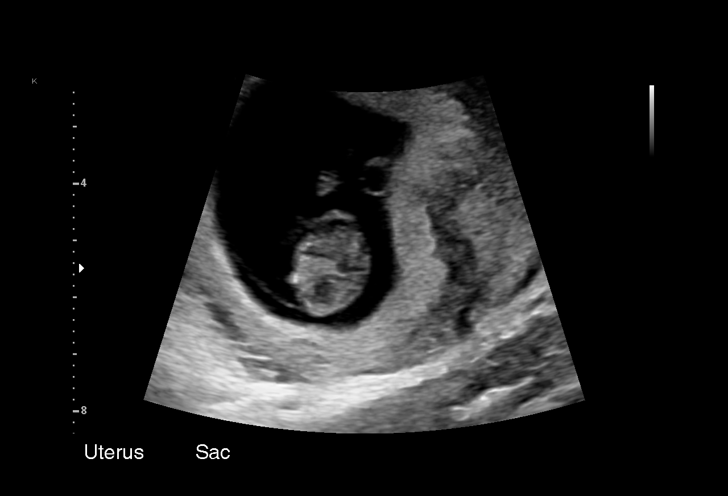
[im 18/33]
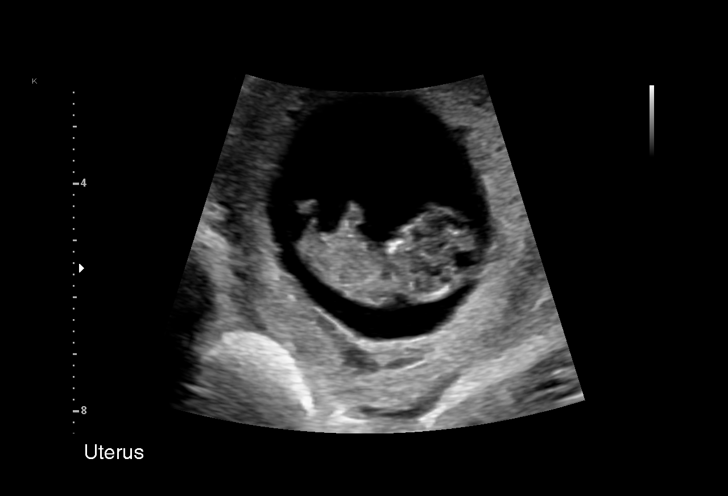
[im 21/33]
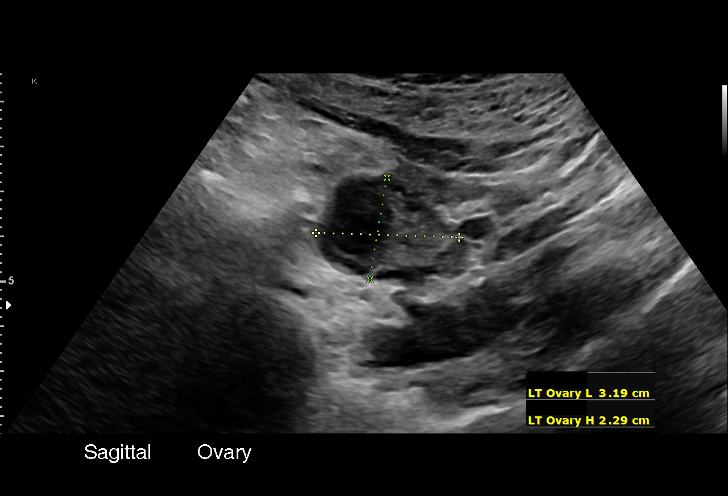
[im 23/33]
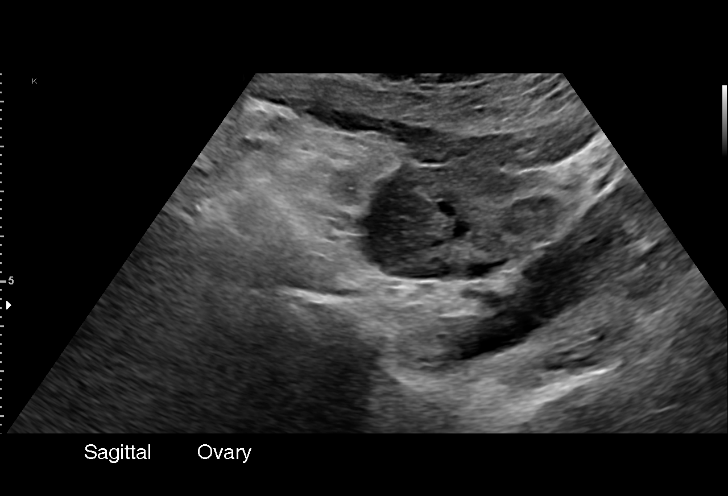
[im 25/33]
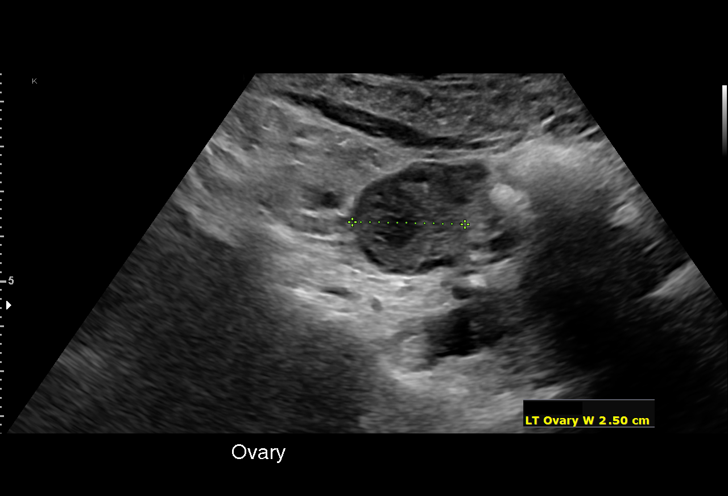
[im 28/33]
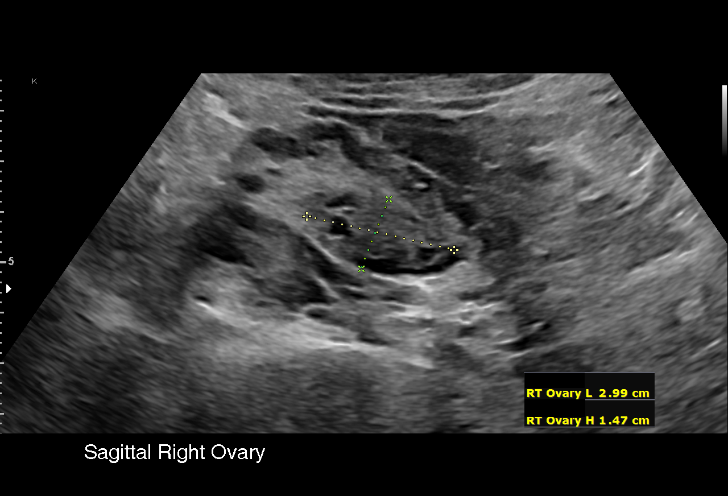
[im 30/33]
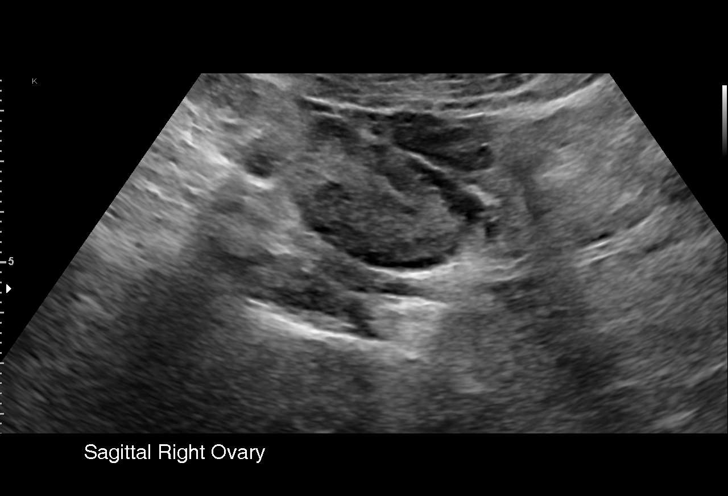
[im 33/33]
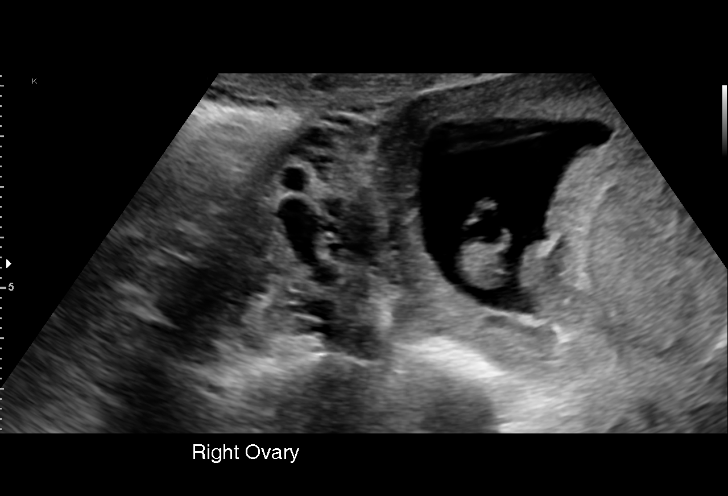

[15 of 28 positions shown; findings below may reference images not displayed]

FINDINGS: Intrauterine gestational sac: Single

Yolk sac:  Visualized.

Embryo:  Visualized.

Cardiac Activity: Visualized.

Heart Rate: 177 bpm

CRL:   31 mm   10 w 0 d                  US EDC: 06/04/2020

Subchorionic hemorrhage:  None visualized.

Maternal uterus/adnexae: Uterus is unremarkable. Left ovary measures
3.2 x 2.3 x 2.5 cm in the right ovary measures 3.0 x 1.5 x 1.5 cm.
No free fluid or pelvic mass.
IMPRESSION: 1. Single live intrauterine pregnancy as above, estimated age 10
weeks and 0 days.

## 2023-11-25 ENCOUNTER — Other Ambulatory Visit (HOSPITAL_BASED_OUTPATIENT_CLINIC_OR_DEPARTMENT_OTHER): Payer: Self-pay | Admitting: *Deleted

## 2023-11-25 DIAGNOSIS — R1084 Generalized abdominal pain: Secondary | ICD-10-CM

## 2023-11-26 ENCOUNTER — Encounter (HOSPITAL_COMMUNITY): Payer: Self-pay

## 2023-11-26 ENCOUNTER — Emergency Department (HOSPITAL_COMMUNITY)
Admission: EM | Admit: 2023-11-26 | Discharge: 2023-11-26 | Disposition: A | Discharge status: Home / Self Care | Attending: Student | Admitting: Student

## 2023-11-26 ENCOUNTER — Ambulatory Visit (HOSPITAL_BASED_OUTPATIENT_CLINIC_OR_DEPARTMENT_OTHER): Admission: RE | Admit: 2023-11-26 | Source: Ambulatory Visit

## 2023-11-26 DIAGNOSIS — R059 Cough, unspecified: Secondary | ICD-10-CM | POA: Diagnosis present

## 2023-11-26 DIAGNOSIS — K529 Noninfective gastroenteritis and colitis, unspecified: Secondary | ICD-10-CM | POA: Diagnosis not present

## 2023-11-26 DIAGNOSIS — E876 Hypokalemia: Secondary | ICD-10-CM | POA: Diagnosis not present

## 2023-11-26 DIAGNOSIS — J069 Acute upper respiratory infection, unspecified: Secondary | ICD-10-CM | POA: Diagnosis not present

## 2023-11-26 LAB — COMPREHENSIVE METABOLIC PANEL
ALT: 23 U/L (ref 0–44)
AST: 24 U/L (ref 15–41)
Albumin: 4.7 g/dL (ref 3.5–5.0)
Alkaline Phosphatase: 64 U/L (ref 38–126)
Anion gap: 15 (ref 5–15)
BUN: 27 mg/dL — ABNORMAL HIGH (ref 6–20)
CO2: 19 mmol/L — ABNORMAL LOW (ref 22–32)
Calcium: 10.2 mg/dL (ref 8.9–10.3)
Chloride: 105 mmol/L (ref 98–111)
Creatinine, Ser: 0.85 mg/dL (ref 0.44–1.00)
GFR, Estimated: >60 mL/min (ref >60)
Glucose, Bld: 139 mg/dL — ABNORMAL HIGH (ref 70–99)
Potassium: 2.9 mmol/L — ABNORMAL LOW (ref 3.5–5.1)
Sodium: 139 mmol/L (ref 135–145)
Total Bilirubin: 1.3 mg/dL — ABNORMAL HIGH (ref 0.0–1.2)
Total Protein: 8.7 g/dL — ABNORMAL HIGH (ref 6.5–8.1)

## 2023-11-26 LAB — RESP PANEL BY RT-PCR (RSV, FLU A&B, COVID)  RVPGX2
Influenza A by PCR: NEGATIVE
Influenza B by PCR: NEGATIVE
Resp Syncytial Virus by PCR: NEGATIVE
SARS Coronavirus 2 by RT PCR: NEGATIVE

## 2023-11-26 LAB — CBC WITH DIFFERENTIAL/PLATELET
Abs Immature Granulocytes: 0.09 10*3/uL — ABNORMAL HIGH (ref 0.00–0.07)
Basophils Absolute: 0 10*3/uL (ref 0.0–0.1)
Basophils Relative: 0 %
Eosinophils Absolute: 0 10*3/uL (ref 0.0–0.5)
Eosinophils Relative: 0 %
HCT: 45.3 % (ref 36.0–46.0)
Hemoglobin: 15.3 g/dL — ABNORMAL HIGH (ref 12.0–15.0)
Immature Granulocytes: 1 %
Lymphocytes Relative: 14 %
Lymphs Abs: 1.5 10*3/uL (ref 0.7–4.0)
MCH: 29.9 pg (ref 26.0–34.0)
MCHC: 33.8 g/dL (ref 30.0–36.0)
MCV: 88.6 fL (ref 80.0–100.0)
Monocytes Absolute: 0.7 10*3/uL (ref 0.1–1.0)
Monocytes Relative: 6 %
Neutro Abs: 8.7 10*3/uL — ABNORMAL HIGH (ref 1.7–7.7)
Neutrophils Relative %: 79 %
Platelets: 398 10*3/uL (ref 150–400)
RBC: 5.11 MIL/uL (ref 3.87–5.11)
RDW: 13.2 % (ref 11.5–15.5)
WBC: 11 10*3/uL — ABNORMAL HIGH (ref 4.0–10.5)
nRBC: 0 % (ref 0.0–0.2)

## 2023-11-26 MED ORDER — LORAZEPAM 2 MG/ML IJ SOLN
0.5000 mg | Freq: Once | INTRAMUSCULAR | Status: AC
  Administered 2023-11-26: 0.5 mg via INTRAVENOUS
  Filled 2023-11-26: qty 1

## 2023-11-26 MED ORDER — POTASSIUM CHLORIDE CRYS ER 20 MEQ PO TBCR
40.0000 meq | EXTENDED_RELEASE_TABLET | Freq: Once | ORAL | Status: AC
  Administered 2023-11-26: 40 meq via ORAL
  Filled 2023-11-26: qty 2

## 2023-11-26 MED ORDER — ONDANSETRON 4 MG PO TBDP: 4.0000 mg | ORAL_TABLET | Freq: Three times a day (TID) | ORAL | 0 refills | Status: AC | PRN

## 2023-11-26 MED ORDER — DIPHENHYDRAMINE HCL 50 MG/ML IJ SOLN
25.0000 mg | Freq: Once | INTRAMUSCULAR | Status: AC
  Administered 2023-11-26: 25 mg via INTRAVENOUS
  Filled 2023-11-26: qty 1

## 2023-11-26 MED ORDER — POTASSIUM CHLORIDE CRYS ER 20 MEQ PO TBCR: 20.0000 meq | EXTENDED_RELEASE_TABLET | Freq: Two times a day (BID) | ORAL | 0 refills | Status: AC

## 2023-11-26 MED ORDER — MAGNESIUM OXIDE -MG SUPPLEMENT 400 (240 MG) MG PO TABS
800.0000 mg | ORAL_TABLET | Freq: Once | ORAL | Status: AC
  Administered 2023-11-26: 800 mg via ORAL
  Filled 2023-11-26: qty 2

## 2023-11-26 MED ORDER — LACTATED RINGERS IV BOLUS
1000.0000 mL | Freq: Once | INTRAVENOUS | Status: AC
  Administered 2023-11-26: 1000 mL via INTRAVENOUS

## 2023-11-26 MED ORDER — PROCHLORPERAZINE EDISYLATE 10 MG/2ML IJ SOLN
10.0000 mg | Freq: Once | INTRAMUSCULAR | Status: AC
  Administered 2023-11-26: 10 mg via INTRAVENOUS
  Filled 2023-11-26: qty 2

## 2023-11-26 NOTE — ED Provider Notes (Signed)
  EMERGENCY DEPARTMENT AT [facility] Provider Note  CSN: [id number] Arrival date & time: 11/26/23 9562  Chief Complaint(s) Cough and Nausea  HPI Kayla Brandt is a 38 y.o. female who presents emergency room for evaluation of nausea, chills and cough.  States that symptoms have been gradually worsening over the last 48 hours.  Was recently seen in urgent care and was given an antibiotic for a reported UTI but unfortunately do not have access to these records.  She states that she did not have any urinary symptoms at that time.  She states that she has been hyperventilating and this usually causes her hands to cramp up.  She does arrive with hand cramping.  Denies chest pain, shortness of breath, headache, fever or other systemic symptoms.   Past Medical History Past Medical History:  Diagnosis Date   Medical history non-contributory    Patient Active Problem List   Diagnosis Date Noted   Postpartum care following vaginal delivery 9/17 06/01/2020   Normal labor 05/31/2020   SVD (9/16) 05/31/2020   Home Medication(s) Prior to Admission medications   Medication Sig Start Date End Date Taking? Authorizing Provider  ondansetron (ZOFRAN-ODT) 4 MG disintegrating tablet Take 1 tablet (4 mg total) by mouth every 8 (eight) hours as needed for nausea or vomiting. 11/26/23  Yes Jaxn Chiquito, MD  potassium chloride SA (KLOR-CON M) 20 MEQ tablet Take 1 tablet (20 mEq total) by mouth 2 (two) times daily for 3 days. 11/26/23 11/29/23 Yes Oakland Fant, MD  acetaminophen (TYLENOL) 325 MG tablet Take 2 tablets (650 mg total) by mouth every 4 (four) hours as needed (for pain scale < 4). 06/03/20   [name], CNM  ibuprofen (ADVIL) 600 MG tablet Take 1 tablet (600 mg total) by mouth every 6 (six) hours. 06/03/20   [name], CNM  Prenatal Vit-Fe Fumarate-FA (MULTIVITAMIN-PRENATAL) 27-0.8 MG TABS tablet Take 1 tablet by mouth daily at 12 noon.    [provider]                                                                                                                                     Past Surgical History Past Surgical History:  Procedure Laterality Date   NO PAST SURGERIES     TUBAL LIGATION N/A 06/02/2020   Procedure: POST PARTUM TUBAL LIGATION;  Surgeon: Osborn Coho, MD;  Location: [facility] LD ORS;  Service: Gynecology;  Laterality: N/A;   Family History Family History  Problem Relation Age of Onset   Hypertension Mother    Healthy Father     Social History Social History   Tobacco Use   Smoking status: Never   Smokeless tobacco: Never  Vaping Use   Vaping status: Never Used  Substance Use Topics   Alcohol use: Not Currently   Drug use: Never   Allergies Patient has no known allergies.  Review of Systems  Review of Systems  Constitutional:  Positive for chills and fatigue.  Respiratory:  Positive for cough.   Musculoskeletal:  Positive for arthralgias and myalgias.    Physical Exam Vital Signs  I have reviewed the triage vital signs BP 124/79 (BP Location: Right Arm)   Pulse (!) 106   Temp (!) 97.4 F (36.3 C) (Oral)   Resp 19   LMP  (LMP Unknown)   SpO2 100%   Physical Exam Vitals and nursing note reviewed.  Constitutional:      General: She is not in acute distress.    Appearance: She is well-developed.  HENT:     Head: Normocephalic and atraumatic.  Eyes:     Conjunctiva/sclera: Conjunctivae normal.  Cardiovascular:     Rate and Rhythm: Normal rate and regular rhythm.     Heart sounds: No murmur heard. Pulmonary:     Effort: Pulmonary effort is normal. No respiratory distress.     Breath sounds: Normal breath sounds.  Abdominal:     Palpations: Abdomen is soft.     Tenderness: There is no abdominal tenderness.  Musculoskeletal:        General: No swelling.     Cervical back: Neck supple.  Skin:    General: Skin is warm and dry.     Capillary Refill: Capillary refill takes less than 2 seconds.   Neurological:     Mental Status: She is alert.     Comments: Bilateral hand cramping  Psychiatric:        Mood and Affect: Mood normal.     ED Results and Treatments Labs (all labs ordered are listed, but only abnormal results are displayed) Labs Reviewed  CBC WITH DIFFERENTIAL/PLATELET - Abnormal; Notable for the following components:      Result Value   WBC 11.0 (*)    Hemoglobin 15.3 (*)    Neutro Abs 8.7 (*)    Abs Immature Granulocytes 0.09 (*)    All other components within normal limits  COMPREHENSIVE METABOLIC PANEL - Abnormal; Notable for the following components:   Potassium 2.9 (*)    CO2 19 (*)    Glucose, Bld 139 (*)    BUN 27 (*)    Total Protein 8.7 (*)    Total Bilirubin 1.3 (*)    All other components within normal limits  RESP PANEL BY RT-PCR (RSV, FLU A&B, COVID)  RVPGX2                                                                                                                          Radiology No results found.  Pertinent labs & imaging results that were available during my care of the patient were reviewed by me and considered in my medical decision making (see MDM for details).  Medications Ordered in ED Medications  LORazepam (ATIVAN) injection 0.5 mg (0.5 mg Intravenous Given 11/26/23 1003)  lactated ringers bolus 1,000 mL (0 mLs Intravenous Stopped 11/26/23 1201)  prochlorperazine (COMPAZINE) injection  10 mg (10 mg Intravenous Given 11/26/23 1028)  diphenhydrAMINE (BENADRYL) injection 25 mg (25 mg Intravenous Given 11/26/23 1029)  potassium chloride SA (KLOR-CON M) CR tablet 40 mEq (40 mEq Oral Given 11/26/23 1158)  magnesium oxide (MAG-OX) tablet 800 mg (800 mg Oral Given 11/26/23 1158)                                                                                                                                     Procedures Procedures  (including critical care time)  Medical Decision Making / ED Course   This patient presents to the  ED for concern of cough, nausea, chills, muscle cramping, this involves an extensive number of treatment options, and is a complaint that carries with it a high risk of complications and morbidity.  The differential diagnosis includes unspecified viral illness, pneumonia, COVID-19, influenza, RSV, electrolyte abnormality, dehydration  MDM: Patient seen emergency room for evaluation of cough and chills with nausea.  Physical exam with cramping of the hands bilaterally but abdominal exam is unremarkable, pulmonary exam is unremarkable with no appreciable wheezing or rales.  No additional neurologic changes including numbness, tingling, weakness or cranial nerve deficits.  Laboratory evaluation with leukocytosis to 11.0, potassium 2.9 which was repleted in the emergency department, CO2 19, total bili 1.3, COVID, flu, RSV negative.  In the setting of no hypoxia and normal pulmonary exam, x-ray imaging deferred.  Patient given small dose of Ativan for nausea control and on reevaluation she states her symptoms have not improved significantly.  She is in given Compazine and Benadryl and on reevaluation she states she feels significantly improved and is able to tolerate p.o. without difficulty.  Hand cramping has resolved as well.  As symptoms have resolved and workup overall reassuring and she currently does not meet inpatient criteria and will be discharged with a short course of potassium repletion and antiemetic use for home.  Was given return precautions of which she voiced understanding patient discharged.   Additional history obtained:  -External records from outside source obtained and reviewed including: Chart review including previous notes, labs, imaging, consultation notes   Lab Tests: -I ordered, reviewed, and interpreted labs.   The pertinent results include:   Labs Reviewed  CBC WITH DIFFERENTIAL/PLATELET - Abnormal; Notable for the following components:      Result Value   WBC 11.0 (*)     Hemoglobin 15.3 (*)    Neutro Abs 8.7 (*)    Abs Immature Granulocytes 0.09 (*)    All other components within normal limits  COMPREHENSIVE METABOLIC PANEL - Abnormal; Notable for the following components:   Potassium 2.9 (*)    CO2 19 (*)    Glucose, Bld 139 (*)    BUN 27 (*)    Total Protein 8.7 (*)    Total Bilirubin 1.3 (*)    All other components within normal limits  RESP PANEL BY RT-PCR (RSV,  FLU A&B, COVID)  RVPGX2      Medicines ordered and prescription drug management: Meds ordered this encounter  Medications   LORazepam (ATIVAN) injection 0.5 mg   lactated ringers bolus 1,000 mL   prochlorperazine (COMPAZINE) injection 10 mg   diphenhydrAMINE (BENADRYL) injection 25 mg   potassium chloride SA (KLOR-CON M) CR tablet 40 mEq   magnesium oxide (MAG-OX) tablet 800 mg   ondansetron (ZOFRAN-ODT) 4 MG disintegrating tablet    Sig: Take 1 tablet (4 mg total) by mouth every 8 (eight) hours as needed for nausea or vomiting.    Dispense:  20 tablet    Refill:  0   potassium chloride SA (KLOR-CON M) 20 MEQ tablet    Sig: Take 1 tablet (20 mEq total) by mouth 2 (two) times daily for 3 days.    Dispense:  6 tablet    Refill:  0    -I have reviewed the patients home medicines and have made adjustments as needed  Critical interventions none    Cardiac Monitoring: The patient was maintained on a cardiac monitor.  I personally viewed and interpreted the cardiac monitored which showed an underlying rhythm of: NSR  Social Determinants of Health:  Factors impacting patients care include: none   Reevaluation: After the interventions noted above, I reevaluated the patient and found that they have :improved  Co morbidities that complicate the patient evaluation  Past Medical History:  Diagnosis Date   Medical history non-contributory       Dispostion: I considered admission for this patient, but at this time she does not meet inpatient criteria for admission and will be  discharged with outpatient follow-up     Final Clinical Impression(s) / ED Diagnoses Final diagnoses:  Viral URI with cough  Hypokalemia  Gastroenteritis     @PCDICTATION @    Glendora Score, MD 11/26/23 1238

## 2023-11-26 NOTE — ED Triage Notes (Signed)
 Pt presents via EMS from home with c/o cough, nausea that started yesterday. Pt has been on an antibiotic for a week for a UTI. Pt was hyperventilating for EMS and upon arrival to ER. Pt c/o cramping in her hands as well.

## 2023-11-29 ENCOUNTER — Ambulatory Visit (HOSPITAL_BASED_OUTPATIENT_CLINIC_OR_DEPARTMENT_OTHER)
Admission: RE | Admit: 2023-11-29 | Discharge: 2023-11-29 | Disposition: A | Discharge status: Home / Self Care | Source: Ambulatory Visit | Attending: *Deleted | Admitting: *Deleted

## 2023-11-29 DIAGNOSIS — R1084 Generalized abdominal pain: Secondary | ICD-10-CM | POA: Diagnosis present
